# Patient Record
Sex: Female | Born: 1975
Health system: Southern US, Community
[De-identification: ages and names within clinical notes are randomized; demographics above are authoritative.]

## PROBLEM LIST (undated history)

## (undated) DIAGNOSIS — K589 Irritable bowel syndrome without diarrhea: Secondary | ICD-10-CM

## (undated) DIAGNOSIS — E039 Hypothyroidism, unspecified: Secondary | ICD-10-CM

## (undated) DIAGNOSIS — K5901 Slow transit constipation: Principal | ICD-10-CM

## (undated) DIAGNOSIS — O09519 Supervision of elderly primigravida, unspecified trimester: Secondary | ICD-10-CM

## (undated) HISTORY — DX: Slow transit constipation: K59.01

## (undated) HISTORY — PX: COLONOSCOPY: SHX174

## (undated) HISTORY — DX: Irritable bowel syndrome, unspecified: K58.9

## (undated) HISTORY — DX: Hypothyroidism, unspecified: E03.9

## (undated) HISTORY — DX: Supervision of elderly primigravida, unspecified trimester: O09.519

## (undated) HISTORY — PX: OTHER SURGICAL HISTORY: SHX169

---

## 2002-02-13 ENCOUNTER — Other Ambulatory Visit: Admission: RE | Admit: 2002-02-13 | Discharge: 2002-02-13 | Payer: Self-pay | Admitting: Family Medicine

## 2005-06-03 ENCOUNTER — Emergency Department (HOSPITAL_COMMUNITY): Admission: EM | Admit: 2005-06-03 | Discharge: 2005-06-03 | Payer: Self-pay | Admitting: Family Medicine

## 2011-11-30 HISTORY — PX: LASIK: SHX215

## 2013-10-19 ENCOUNTER — Encounter: Payer: Self-pay | Admitting: Internal Medicine

## 2013-10-19 ENCOUNTER — Ambulatory Visit (INDEPENDENT_AMBULATORY_CARE_PROVIDER_SITE_OTHER): Payer: Federal, State, Local not specified - PPO | Admitting: Internal Medicine

## 2013-10-19 VITALS — BP 110/64 | HR 75 | Temp 98.1°F | Resp 10 | Ht <= 58 in | Wt 123.4 lb

## 2013-10-19 DIAGNOSIS — E039 Hypothyroidism, unspecified: Secondary | ICD-10-CM

## 2013-10-19 LAB — TSH: TSH: 0.94 u[IU]/mL (ref 0.35–5.50)

## 2013-10-19 LAB — T3, FREE: T3, Free: 2.5 pg/mL (ref 2.3–4.2)

## 2013-10-19 LAB — T4, FREE: Free T4: 0.94 ng/dL (ref 0.60–1.60)

## 2013-10-19 NOTE — Progress Notes (Signed)
Patient ID: Ann Rogers, female   DOB: [birth year], 37 y.o.   MRN: [id number]   HPI  Ann Rogers is a 37 y.o.-year-old female, self- referred for evaluation for hypothyroidism.  Pt. has been dx with hypothyroidism 2 mo ago (TSH high, but pt not told how high, and records not available) >> started on Levothyroxine 25 mcg, taken: - fasting - with water - no b'fast  - prenatal MVI at night - no calcium - no iron - no PPIs  Pt denies feeling nodules in neck, hoarseness, dysphagia/odynophagia, SOB with lying down; she c/o: - + weight gain - no time to exercise b/c 12h work days  - she commutes to [place] daily. Skips b'fast and gets dinner on the way home. She drinks sodas. Gained 7 lbs.  - + fatigue - b/c of lifestyle - + constipation, but improved on Levothyroxine - + feeling cold - all her life - no dry skin - no hair falling - no problems with concentration - no depression  No FH of thyroid disorders. No FH of thyroid cancer. No h/o radiation tx to head or neck.  ROS: Constitutional: see above Eyes: no blurry vision, no xerophthalmia ENT: + sore throat, no nodules palpated in throat, no dysphagia/odynophagia, no hoarseness Cardiovascular: no CP/SOB/palpitations/leg swelling Respiratory: no cough/SOB Gastrointestinal: no N/V/D/+ C Musculoskeletal: no muscle/joint aches Skin: no rashes Neurological: no tremors/numbness/tingling/dizziness Psychiatric: no depression/anxiety  Past Medical History  Diagnosis Date  . Thyroid disease    History reviewed. No pertinent past surgical history. History   Social History  . Marital Status: Widowed    Spouse Name: N/A    Number of Children: 0   Occupational History  . [employer] employee - commutes to [place] daily   Social History Main Topics  . Smoking status: Never Smoker   . Smokeless tobacco: Not on file  . Alcohol Use: No  . Drug Use: No  . 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 Codeine Nausea And Vomiting   Family History  Problem Relation Age of Onset  . Stroke Mother   . Hypertension Father   . Hyperlipidemia Father     PE: BP 110/64  Pulse 75  Temp(Src) 98.1 F (36.7 C) (Oral)  Resp 10  Ht 4\' 10"  (1.473 m)  Wt 123 lb 6.4 oz (55.974 kg)  BMI 25.80 kg/m2  SpO2 95%  LMP 10/18/2013 Wt Readings from Last 3 Encounters:  10/19/13 123 lb 6.4 oz (55.974 kg)   Constitutional: normal weight, in NAD Eyes: PERRLA, EOMI, no exophthalmos ENT: moist mucous membranes, no thyromegaly, no cervical lymphadenopathy Cardiovascular: RRR, No MRG Respiratory: CTA B Gastrointestinal: abdomen soft, NT, ND, BS+ Musculoskeletal: no deformities, strength intact in all 4 Skin: moist, warm, no rashes Neurological: no tremor with outstretched hands, DTR normal in all 4  ASSESSMENT: 1. Hypothyroidism  PLAN:  1. Patient with newly dx hypothyroidism (high TSH), on levothyroxine therapy 25 mcg - takes it correctly. She appears euthyroid. - We discussed about correct intake of levothyroxine, fasting, with water, separated by at least 30 minutes from breakfast, and separated by more than 4 hours from calcium, iron, multivitamins, acid reflux medications (PPIs). - She does not appear to have a goiter, thyroid nodules, or neck compression symptoms - we'll check thyroid tests today: TSH, free T4,  anti-TPO Abx - If these are abnormal, she will need to return in 6-8 weeks for repeat labs. If she needs to continue thyroid hh, she prefers brand name Synthroid. If TSH is 5-20, we can just follow it.  - If these are normal, I will see her back in 3 months  Office Visit on 10/19/2013  Component Date Value Range Status  . TSH 10/19/2013 0.94  0.35 - 5.50 uIU/mL Final  . Free T4 10/19/2013 0.94  0.60 - 1.60 ng/dL Final  . T3, Free 16/08/9603  2.5  2.3 - 4.2 pg/mL Final  . Thyroid Peroxidase Antibody 10/19/2013 10.6  <35.0 IU/mL Final   Comment:                            The thyroid microsomal antigen has been shown to be Thyroid                          Peroxidase (TPO).  This assay detects anti-TPO antibodies.   Thyroid peroxidase antibodies not high, so probably not Hashimoto thyroiditis. We'll give the patient the option of either continuing the levothyroxine at the current dose or discontinuing it and rechecking labs in 6 weeks from now to see if we need to restart the thyroid hormone at that time.

## 2013-10-19 NOTE — Patient Instructions (Addendum)
Please come back for a follow-up appointment in 3 months. Please stop at the lab. Please join MyChart.  Hypothyroidism The thyroid is a large gland located in the lower front of your neck. The thyroid gland helps control metabolism. Metabolism is how your body handles food. It controls metabolism with the hormone thyroxine. When this gland is underactive (hypothyroid), it produces too little hormone.  CAUSES These include:   Absence or destruction of thyroid tissue.  Goiter due to iodine deficiency.  Goiter due to medications.  Congenital defects (since birth).  Problems with the pituitary. This causes a lack of TSH (thyroid stimulating hormone). This hormone tells the thyroid to turn out more hormone. SYMPTOMS  Lethargy (feeling as though you have no energy)  Cold intolerance  Weight gain (in spite of normal food intake)  Dry skin  Coarse hair  Menstrual irregularity (if severe, may lead to infertility)  Slowing of thought processes Cardiac problems are also caused by insufficient amounts of thyroid hormone. Hypothyroidism in the newborn is cretinism, and is an extreme form. It is important that this form be treated adequately and immediately or it will lead rapidly to retarded physical and mental development. DIAGNOSIS  To prove hypothyroidism, your caregiver may do blood tests and ultrasound tests. Sometimes the signs are hidden. It may be necessary for your caregiver to watch this illness with blood tests either before or after diagnosis and treatment. TREATMENT  Low levels of thyroid hormone are increased by using synthetic thyroid hormone. This is a safe, effective treatment. It usually takes about four weeks to gain the full effects of the medication. After you have the full effect of the medication, it will generally take another four weeks for problems to leave. Your caregiver may start you on low doses. If you have had heart problems the dose may be gradually increased.  It is generally not an emergency to get rapidly to normal. HOME CARE INSTRUCTIONS   Take your medications as your caregiver suggests. Let your caregiver know of any medications you are taking or start taking. Your caregiver will help you with dosage schedules.  As your condition improves, your dosage needs may increase. It will be necessary to have continuing blood tests as suggested by your caregiver.  Report all suspected medication side effects to your caregiver. SEEK MEDICAL CARE IF: Seek medical care if you develop:  Sweating.  Tremulousness (tremors).  Anxiety.  Rapid weight loss.  Heat intolerance.  Emotional swings.  Diarrhea.  Weakness. SEEK IMMEDIATE MEDICAL CARE IF:  You develop chest pain, an irregular heart beat (palpitations), or a rapid heart beat. MAKE SURE YOU:   Understand these instructions.  Will watch your condition.  Will get help right away if you are not doing well or get worse. Document Released: 11/15/2005 Document Revised: 02/07/2012 Document Reviewed: 07/05/2008 [facility] Patient Information 2014 [place], [place].

## 2013-10-22 ENCOUNTER — Encounter: Payer: Self-pay | Admitting: Internal Medicine

## 2013-10-22 LAB — THYROID PEROXIDASE ANTIBODY: Thyroperoxidase Ab SerPl-aCnc: 10.6 IU/mL (ref <35.0)

## 2013-10-23 ENCOUNTER — Encounter: Payer: Self-pay | Admitting: *Deleted

## 2013-11-06 ENCOUNTER — Telehealth: Payer: Self-pay | Admitting: *Deleted

## 2013-11-06 NOTE — Telephone Encounter (Signed)
Pt called requesting a follow up appt. She stated she had lab work done at her OB-Gyn office and her numbers are high now. They want her to begin on 75 mcg of levothyroxine. Pt is confused about what she should do. Advised pt to get a copy of labs faxed to our office, pt stated she would. Also, scheduled a follow up appt with Dr Elvera Lennox for pt on Monday, Dec 15th. Be advised.

## 2013-11-08 ENCOUNTER — Other Ambulatory Visit: Payer: Self-pay | Admitting: *Deleted

## 2013-11-08 MED ORDER — LEVOTHYROXINE SODIUM 25 MCG PO TABS: 25.0000 ug | ORAL_TABLET | Freq: Every day | ORAL | Status: DC

## 2013-11-12 ENCOUNTER — Ambulatory Visit (INDEPENDENT_AMBULATORY_CARE_PROVIDER_SITE_OTHER): Payer: Federal, State, Local not specified - PPO | Admitting: Internal Medicine

## 2013-11-12 ENCOUNTER — Encounter: Payer: Self-pay | Admitting: Internal Medicine

## 2013-11-12 VITALS — HR 90 | Temp 98.6°F | Resp 10 | Wt 124.9 lb

## 2013-11-12 DIAGNOSIS — E039 Hypothyroidism, unspecified: Secondary | ICD-10-CM

## 2013-11-12 NOTE — Progress Notes (Signed)
Patient ID: Ann Rogers, female   DOB: 03/25/1976, 37 y.o.   MRN: [id number]   HPI  Ann Rogers is a 37 y.o.-year-old female, self- referred for evaluation for hypothyroidism, dx 07/2013, returning for f/u. Last visit ~1 mo ago.  Pt. has been dx with hypothyroidism 3 mo ago (TSH high, but pt not told how high, and records not available) >> started on Levothyroxine 25 mcg, taken: - fasting - with water - no b'fast  - prenatal MVI at night - no calcium - no iron - no PPIs  Latest labs: Office Visit on 10/19/2013  Component Date Value Range Status  . TSH 10/19/2013 0.94  0.35 - 5.50 uIU/mL Final  . Free T4 10/19/2013 0.94  0.60 - 1.60 ng/dL Final  . T3, Free 11/91/4782 2.5  2.3 - 4.2 pg/mL Final  . Thyroid Peroxidase Antibody 10/19/2013 10.6  <35.0 IU/mL Final   Comment:                            The thyroid microsomal antigen has been shown to be Thyroid                          Peroxidase (TPO).  This assay detects anti-TPO antibodies.   Thyroid peroxidase antibodies not high, so probably not Hashimoto thyroiditis. I gave the patient the option of either continuing the levothyroxine at the current dose or discontinuing it and rechecking labs >> opted to continue, but had labs 11/05/2013 by ObGyn: TSH 4. 627 (0.350-4.5). She was advised to increase the Synthroid to 75 mcg, but she wanted to check with me first.  Pt denies feeling nodules in neck, hoarseness, dysphagia/odynophagia, SOB with lying down; she c/o: - + weight gain - no time to exercise b/c 12h work days  - she commutes to [place] daily. Skips b'fast and gets dinner on the way home. She drinks sodas. Gained 7 lbs.  - + fatigue - b/c of lifestyle - + constipation, improved on Levothyroxine, but still bothersome (no stool in 2 weeks!) - + feeling cold - all her life - no dry skin - no hair falling - no problems with concentration - no depression  I reviewed pt's medications, allergies, PMH, social hx, family  hx and no changes required, except as mentioned above.   ROS: Constitutional: see above Eyes: no blurry vision, no xerophthalmia ENT: no sore throat, no nodules palpated in throat, no dysphagia/odynophagia, no hoarseness Cardiovascular: no CP/SOB/palpitations/leg swelling Respiratory: no cough/SOB Gastrointestinal: no N/V/D/+++ C Musculoskeletal: no muscle/joint aches Skin: no rashes Neurological: no tremors/numbness/tingling/dizziness  I reviewed pt's medications, allergies, PMH, social hx, family hx and no changes required, except as mentioned above.  PE: Pulse 90  Temp(Src) 98.6 F (37 C) (Oral)  Resp 10  Wt 124 lb 14.4 oz (56.654 kg)  SpO2 96%  LMP 10/18/2013 Wt Readings from Last 3 Encounters:  11/12/13 124 lb 14.4 oz (56.654 kg)  10/19/13 123 lb 6.4 oz (55.974 kg)   Constitutional: normal weight, in NAD Eyes: PERRLA, EOMI, no exophthalmos ENT: moist mucous membranes, no thyromegaly, no cervical lymphadenopathy Cardiovascular: RRR, No MRG Respiratory: CTA B Gastrointestinal: abdomen soft, NT, ND, BS+ Musculoskeletal: no deformities, strength intact in all 4 Skin: moist, warm, no rashes Neurological: no tremor with outstretched hands, DTR normal in all 4  ASSESSMENT: 1. Hypothyroidism  PLAN:  1. Patient with newly dx hypothyroidism (high TSH), on levothyroxine therapy  25 mcg - takes it correctly. She appears euthyroid. - at last visit, thyroid tests were all normal TSH, free T4, anti-TPO Abx; but 2 weeks later >> TSH increased at [facility] office; I suspect the difference was due to TSH assay difference - we decided to have her stay on the 25 mcg for now and she will need to return in 6-8 weeks for repeat labs. She is happy with this plan. - advised her to use Magnesium 500 mg daily for constipation - I will see her back in 6 months  ObGyn: Dr Teodora Medici

## 2013-11-12 NOTE — Patient Instructions (Signed)
Please come back for a follow-up appointment in 6 months. Please return in 2 months for labs. Continue the Synthroid at 25 mcg daily for now. Try Magnesium 500 mg daily for constipation.

## 2013-12-31 ENCOUNTER — Ambulatory Visit (INDEPENDENT_AMBULATORY_CARE_PROVIDER_SITE_OTHER): Payer: Federal, State, Local not specified - PPO | Admitting: Emergency Medicine

## 2013-12-31 VITALS — BP 100/60 | HR 68 | Temp 98.5°F | Resp 16 | Ht 59.0 in | Wt 121.0 lb

## 2013-12-31 DIAGNOSIS — J101 Influenza due to other identified influenza virus with other respiratory manifestations: Secondary | ICD-10-CM

## 2013-12-31 DIAGNOSIS — J111 Influenza due to unidentified influenza virus with other respiratory manifestations: Secondary | ICD-10-CM

## 2013-12-31 DIAGNOSIS — R6889 Other general symptoms and signs: Secondary | ICD-10-CM

## 2013-12-31 LAB — POCT INFLUENZA A/B
INFLUENZA A, POC: POSITIVE
Influenza B, POC: NEGATIVE

## 2013-12-31 MED ORDER — OSELTAMIVIR PHOSPHATE 75 MG PO CAPS: 75.0000 mg | ORAL_CAPSULE | Freq: Two times a day (BID) | ORAL | Status: DC

## 2013-12-31 MED ORDER — HYDROCOD POLST-CHLORPHEN POLST 10-8 MG/5ML PO LQCR: 5.0000 mL | Freq: Two times a day (BID) | ORAL | Status: DC | PRN

## 2013-12-31 NOTE — Progress Notes (Signed)
   Subjective:    Patient ID: Ann Rogers, female    DOB: 11/03/1976, 38 y.o.   MRN: [id number]  HPI patient presents with 36 history of head congestion and cough. She has had sweats and chills on Saturday. She had some myalgias with these symptoms. She denies having a productive cough.    Review of Systems     Objective:   Physical Exam HEENT exam TMs are clear nose is normal throat is slightly red. Neck is supple. Chest is clear to both auscultation and percussion Results for orders placed in visit on 12/31/13  POCT INFLUENZA A/B      Result Value Range   Influenza A, POC Positive     Influenza B, POC Negative           Assessment & Plan:  We'll treat with Tamiflu and Tussionex. She was given a note until Thursday

## 2013-12-31 NOTE — Patient Instructions (Signed)

## 2014-01-11 DIAGNOSIS — K589 Irritable bowel syndrome without diarrhea: Secondary | ICD-10-CM | POA: Insufficient documentation

## 2014-01-15 ENCOUNTER — Other Ambulatory Visit: Payer: Federal, State, Local not specified - PPO

## 2014-01-23 ENCOUNTER — Other Ambulatory Visit (INDEPENDENT_AMBULATORY_CARE_PROVIDER_SITE_OTHER): Payer: Federal, State, Local not specified - PPO

## 2014-01-23 DIAGNOSIS — E039 Hypothyroidism, unspecified: Secondary | ICD-10-CM

## 2014-01-23 LAB — T4, FREE: FREE T4: 1.03 ng/dL (ref 0.60–1.60)

## 2014-01-23 LAB — TSH: TSH: 1.51 u[IU]/mL (ref 0.35–5.50)

## 2014-01-24 ENCOUNTER — Ambulatory Visit: Payer: Federal, State, Local not specified - PPO | Admitting: Internal Medicine

## 2014-02-25 ENCOUNTER — Encounter: Payer: Self-pay | Admitting: Internal Medicine

## 2014-04-17 ENCOUNTER — Ambulatory Visit: Payer: Federal, State, Local not specified - PPO | Admitting: Internal Medicine

## 2014-05-02 ENCOUNTER — Ambulatory Visit: Payer: Federal, State, Local not specified - PPO | Admitting: Internal Medicine

## 2014-05-13 ENCOUNTER — Encounter: Payer: Self-pay | Admitting: Internal Medicine

## 2014-05-13 ENCOUNTER — Ambulatory Visit (INDEPENDENT_AMBULATORY_CARE_PROVIDER_SITE_OTHER): Payer: Federal, State, Local not specified - PPO | Admitting: Internal Medicine

## 2014-05-13 VITALS — BP 108/68 | HR 80 | Temp 97.9°F | Resp 14 | Ht 59.0 in | Wt 114.8 lb

## 2014-05-13 DIAGNOSIS — E039 Hypothyroidism, unspecified: Secondary | ICD-10-CM

## 2014-05-13 NOTE — Progress Notes (Signed)
Patient ID: Ann Fettersomeika L Saine, female   DOB: 04/15/1976, 38 y.o.   MRN: [id number]   HPI  Ann Rogers is a 38 y.o.-year-old female, returning for f/u forhypothyroidism, dx 07/2013. Last visit 6 mo ago.  Reviewed hx: Pt. has been dx with hypothyroidism in 07/2013 (TSH high, but pt not told how high, and records not available) >> started on Levothyroxine 25 mcg.  She takes the LT4: - fasting - with water - no b'fast  - prenatal MVI at night - no calcium - no iron - no PPIs  Latest labs: Lab Results  Component Value Date   TSH 1.51 01/23/2014   TSH 0.94 10/19/2013   FREET4 1.03 01/23/2014   FREET4 0.94 10/19/2013   . Thyroid Peroxidase Antibody 10/19/2013 10.6  <35.0 IU/mL Final  Thyroid peroxidase antibodies not high, so likely not Hashimoto thyroiditis.   I gave the patient the option of either continuing the levothyroxine at the current dose or discontinuing it and rechecking labs >> opted to continue. A new set of labs in 12/2013 >> normal (see above).  Pt denies feeling nodules in neck, hoarseness, dysphagia/odynophagia, SOB with lying down.  She c/o: - + weight loss - lost 10 lbs since last visit ! - + fatigue - b/c of lifestyle - + constipation, very bothersome (has a stool every 2 weeks) >> will see Dr Leone PayorGessner soon - + feeling cold - all her life - no dry skin - no hair falling - no problems with concentration - no depression  She tells me she would like to get pregnant (IVF, sperm donor) - in 11/2014.   I reviewed pt's medications, allergies, PMH, social hx, family hx and no changes required, except as mentioned above.   ROS: Constitutional: see above Eyes: no blurry vision, no xerophthalmia ENT: no sore throat, no nodules palpated in throat, no dysphagia/odynophagia, no hoarseness Cardiovascular: no CP/SOB/palpitations/leg swelling Respiratory: no cough/SOB Gastrointestinal: no N/V/D/+++ C Musculoskeletal: no muscle/joint aches Skin: no  rashes Neurological: no tremors/numbness/tingling/dizziness  I reviewed pt's medications, allergies, PMH, social hx, family hx and no changes required, except as mentioned above.  PE: BP 108/68  Pulse 80  Temp(Src) 97.9 F (36.6 C)  Resp 14  Ht 4\' 11"  (1.499 m)  Wt 114 lb 12.8 oz (52.073 kg)  BMI 23.17 kg/m2  SpO2 97% Wt Readings from Last 3 Encounters:  05/13/14 114 lb 12.8 oz (52.073 kg)  12/31/13 121 lb (54.885 kg)  11/12/13 124 lb 14.4 oz (56.654 kg)   Constitutional: normal weight, in NAD Eyes: PERRLA, EOMI, no exophthalmos ENT: moist mucous membranes, no thyromegaly, no cervical lymphadenopathy Cardiovascular: RRR, No MRG Respiratory: CTA B Gastrointestinal: abdomen soft, NT, ND, BS+ Musculoskeletal: no deformities, strength intact in all 4 Skin: moist, warm, no rashes Neurological: no tremor with outstretched hands, DTR normal in all 4  ASSESSMENT: 1. Hypothyroidism  PLAN:  1. Patient with mild hypothyroidism (high TSH), on levothyroxine (LT4) therapy 25 mcg - takes it correctly. She appears euthyroid. - at last check, thyroid tests were all normal TSH, free T4, anti-TPO Abx - we will recheck TSH, free t4 today - we decided to have her stay on the LT4 25 mcg for now, especially since she tries to get pregnant - I advised her to come back in 09/2014 for TFTs (ordered), ~1.5 mo before her IVF. I will then see her 1 mo after she gets pregnant, or in 1 year, whichever is sooner.  Office Visit on 05/13/2014  Component Date  Value Ref Range Status  . TSH 05/13/2014 0.59  0.35 - 4.50 uIU/mL Final  . Free T4 05/13/2014 1.00  0.60 - 1.60 ng/dL Final   Labs normal. Continue LT4 25 mcg daily.

## 2014-05-13 NOTE — Patient Instructions (Signed)
Please return in 09/2014 for repeat labs. Please come back for an appointment 1 month after the pregnancy.

## 2014-05-14 LAB — T4, FREE: Free T4: 1 ng/dL (ref 0.60–1.60)

## 2014-05-14 LAB — TSH: TSH: 0.59 u[IU]/mL (ref 0.35–4.50)

## 2014-06-06 ENCOUNTER — Ambulatory Visit (INDEPENDENT_AMBULATORY_CARE_PROVIDER_SITE_OTHER): Payer: Federal, State, Local not specified - PPO | Admitting: Internal Medicine

## 2014-06-06 ENCOUNTER — Other Ambulatory Visit: Payer: Federal, State, Local not specified - PPO

## 2014-06-06 ENCOUNTER — Other Ambulatory Visit (INDEPENDENT_AMBULATORY_CARE_PROVIDER_SITE_OTHER): Payer: Federal, State, Local not specified - PPO

## 2014-06-06 ENCOUNTER — Encounter: Payer: Self-pay | Admitting: Internal Medicine

## 2014-06-06 VITALS — BP 102/60 | HR 68 | Ht 58.66 in | Wt 116.1 lb

## 2014-06-06 DIAGNOSIS — K5901 Slow transit constipation: Secondary | ICD-10-CM

## 2014-06-06 DIAGNOSIS — K5909 Other constipation: Secondary | ICD-10-CM

## 2014-06-06 DIAGNOSIS — K5900 Constipation, unspecified: Secondary | ICD-10-CM

## 2014-06-06 HISTORY — DX: Slow transit constipation: K59.01

## 2014-06-06 LAB — IGA: IgA: 211 mg/dL (ref 68–378)

## 2014-06-06 NOTE — Progress Notes (Signed)
Referred by: Jarome Matin, MD  Subjective:    Patient ID: Ann Rogers, female    DOB: 1975-12-01, 38 y.o.   MRN: [id number]  HPI Patient is a very nice single woman with a long history of constipation dating back to her high school years. She generally moves her bowels about once a week with intermittent progressive bloating and abdominal distention. When she has her menstrual period she tends to move her bowels more. She has tried MiraLax with some benefit but had to titrate up on the dosage. In the past 2 months Linzess 290 mcg daily did not really help. He also uses some sort of 2 units seems to make a difference for her. It helped her defecate. She is not having bleeding. She says that when she gets an urge to defecate she does not have to sit or strain her have difficulty passing of stool. Dulcolax also helps her defecate. She is planning to become pregnant and next year and is concerned that pregnancy exacerbates constipation and would like to feel improved prior to that. Her GI review of systems is otherwise negative. Allergies  Allergen Reactions  . Codeine Nausea And Vomiting   Outpatient Prescriptions Prior to Visit  Medication Sig Dispense Refill  . levothyroxine (SYNTHROID, LEVOTHROID) 25 MCG tablet Take 1 tablet (25 mcg total) by mouth daily before breakfast.  30 tablet  11   No facility-administered medications prior to visit.   Past Medical History  Diagnosis Date  . Hypothyroidism   . IBS (irritable bowel syndrome)    Past Surgical History  Procedure Laterality Date  . Lasik  2013  . Egg preservation     History   Social History  . Marital Status: Widowed    Spouse Name: N/A    Number of Children: 0  . Years of Education: N/A   Occupational History  . Chiropodist at [employer]   .     Social History Main Topics  . Smoking status: Never Smoker   . Smokeless tobacco: Never Used  . Alcohol Use: No  . Drug Use: No  . Sexual Activity: No     Other Topics Concern  . None   Social History Narrative   She is single, no children lives in [place]    [employer]   Regular exercise: no   Caffeine use: coffee in the am; 3-4 sodas daily   Last updated 06/06/2014            Family History  Problem Relation Age of Onset  . Stroke Mother   . Hypertension Father   . Hyperlipidemia Father          Review of Systems This is positive for those things mentioned in the history of present illness. All other review of systems negative.    Objective:   Physical Exam General:  Well-developed, well-nourished and in no acute distress Eyes:  anicteric. ENT:   Mouth and posterior pharynx free of lesions.  Neck:   supple w/o thyromegaly or mass.  Lungs: Clear to auscultation bilaterally. Heart:  S1S2, no rubs, murmurs, gallops. Abdomen:  soft, non-tender, no hepatosplenomegaly, hernia, or mass and BS+.  Rectal:  Patti [place], CMA present.   Anoderm inspection revealed normal Anal wink was absent Digital exam revealed normal resting tone and voluntary squeeze. No mass or rectocele present. Simulated defecation with valsalva revealed appropriate abdominal contraction and descent.    Lymph:  no cervical or supraclavicular adenopathy. Extremities:  no edema Skin   no rash. Neuro:  A&O x 3.  Psych:  appropriate mood and  Affect.   Data Reviewed:  Endocrinology notes, primary care notes Lab Results  Component Value Date   TSH 0.59 05/13/2014      Assessment & Plan:    Chronic constipation This sounds most like slow transit constipation problems. Cause of this could be primary idiopathic motility disturbance, bacterial overgrowth is possible, there is a small chance of celiac disease causing some of her problems as well.  #1 she will do a Sitzmarks study to determine motility of the colon #2 after that she can use intermittent Dulcolax, also MiraLax #3 depending upon  the results of the above she could need an anorectal manometry study. #4 we will call her with results and plans in followup. Since Linzess didn't seem to help I don't think Amitiza would Though it might  CC: PATERSON,DANIEL G, MD I appreciate the opportunity to care for this patient.

## 2014-06-06 NOTE — Assessment & Plan Note (Addendum)
This sounds most like slow transit constipation problems. Cause of this could be primary idiopathic motility disturbance, bacterial overgrowth is possible, there is a small chance of celiac disease causing some of her problems as well.  #1 she will do a Sitzmarks study to determine motility of the colon #2 after that she can use intermittent Dulcolax, also MiraLax #3 depending upon the results of the above she could need an anorectal manometry study. #4 we will call her with results and plans in followup. Since Linzess didn't seem to help I don't think Amitiza would Though it might  CC: PATERSON,DANIEL G, MD I appreciate the opportunity to care for this patient.

## 2014-06-06 NOTE — Patient Instructions (Addendum)
We have sent the following medications to your pharmacy for you to pick up at your convenience: TTG, IGA  You may use dulcolax as needed after you do the sitzmark test.   Today you have been given a Sitzmark test today and please come back Tues. 06/11/14 and go to our basement radiology dept to get an x-ray.  Do not use an laxatives until this test is completed.   I appreciate the opportunity to care for you.

## 2014-06-07 LAB — TISSUE TRANSGLUTAMINASE, IGA: Tissue Transglutaminase Ab, IgA: 3.4 U/mL (ref <20)

## 2014-06-09 NOTE — Progress Notes (Signed)
Quick Note:  Does not have celiac dz by this Awaiting sitz marks results ______

## 2014-06-11 ENCOUNTER — Ambulatory Visit (INDEPENDENT_AMBULATORY_CARE_PROVIDER_SITE_OTHER)
Admission: RE | Admit: 2014-06-11 | Discharge: 2014-06-11 | Disposition: A | Discharge status: Home / Self Care | Payer: Federal, State, Local not specified - PPO | Source: Ambulatory Visit | Attending: Internal Medicine | Admitting: Internal Medicine

## 2014-06-11 DIAGNOSIS — K5909 Other constipation: Secondary | ICD-10-CM

## 2014-06-11 DIAGNOSIS — K5900 Constipation, unspecified: Secondary | ICD-10-CM

## 2014-06-12 ENCOUNTER — Other Ambulatory Visit: Payer: Self-pay

## 2014-06-12 NOTE — Progress Notes (Signed)
Quick Note:  Also let her know testing negative for celiac dz ______

## 2014-06-12 NOTE — Progress Notes (Signed)
Quick Note:  All markers are in left colon.  So, could be an outlet problem also - need to check anorectal function with an anorectal manometry - please explain and schedule ______

## 2014-07-01 ENCOUNTER — Ambulatory Visit (HOSPITAL_COMMUNITY)
Admission: RE | Admit: 2014-07-01 | Discharge: 2014-07-01 | Disposition: A | Discharge status: Home / Self Care | Payer: Federal, State, Local not specified - PPO | Source: Ambulatory Visit | Attending: Internal Medicine | Admitting: Internal Medicine

## 2014-07-01 ENCOUNTER — Encounter (HOSPITAL_COMMUNITY)
Admission: RE | Disposition: A | Discharge status: Home / Self Care | Payer: Federal, State, Local not specified - PPO | Source: Ambulatory Visit | Attending: Internal Medicine

## 2014-07-01 DIAGNOSIS — K5900 Constipation, unspecified: Secondary | ICD-10-CM | POA: Insufficient documentation

## 2014-07-01 HISTORY — PX: ANAL RECTAL MANOMETRY: SHX6358

## 2014-07-01 SURGERY — MANOMETRY, ANORECTAL

## 2014-07-02 ENCOUNTER — Encounter (HOSPITAL_COMMUNITY): Payer: Self-pay | Admitting: Internal Medicine

## 2014-07-02 DIAGNOSIS — K5900 Constipation, unspecified: Secondary | ICD-10-CM

## 2014-07-16 ENCOUNTER — Telehealth: Payer: Self-pay | Admitting: Internal Medicine

## 2014-07-16 NOTE — Telephone Encounter (Signed)
Sorry for delay Ano-rectal manometry was ok except for slight impairment of relaxation of her pelvic floor with simulated defaction.  Ask her to schedule a f/u with mein office  Also find out current bowel habits and laxative use

## 2014-07-16 NOTE — Telephone Encounter (Signed)
Spoke with patient and gave her results. Scheduled OV on 08/07/14 at 2:15 PM. Patient states after the manometry she started the Linzess 290 mcg daily and she is having a BM daily now.

## 2014-07-16 NOTE — Telephone Encounter (Signed)
Patient is asking for manometry results. Please, advise. 

## 2014-07-16 NOTE — Telephone Encounter (Signed)
Left a message to call back.

## 2014-08-07 ENCOUNTER — Ambulatory Visit (INDEPENDENT_AMBULATORY_CARE_PROVIDER_SITE_OTHER): Payer: Federal, State, Local not specified - PPO | Admitting: Internal Medicine

## 2014-08-07 ENCOUNTER — Encounter: Payer: Self-pay | Admitting: Internal Medicine

## 2014-08-07 VITALS — BP 92/60 | HR 88 | Ht 59.0 in | Wt 116.0 lb

## 2014-08-07 DIAGNOSIS — K5901 Slow transit constipation: Secondary | ICD-10-CM

## 2014-08-07 MED ORDER — LUBIPROSTONE 24 MCG PO CAPS: 24.0000 ug | ORAL_CAPSULE | Freq: Two times a day (BID) | ORAL | Status: DC

## 2014-08-07 NOTE — Patient Instructions (Signed)
Per Dr. Leone Payor, take 4 Dulcolax, purchased over the counter.  1 hour later, mix one bottle of Miralax in 64 ounces of Gatorade, drinking it over 1-2 hours.  Continue taking your Linzess.  Dr. Leone Payor has also called in Amitiza - take once a day with food.  Please call Dr. Leone Payor in early October with an update on how this is working.  Please follow up on 10/07/2014 at 2:45

## 2014-08-08 ENCOUNTER — Encounter: Payer: Self-pay | Admitting: Internal Medicine

## 2014-08-08 NOTE — Assessment & Plan Note (Signed)
MiraLax purge then use Linzess and Amitiza F/U 2 months Communicate failure in between Other options include colchicine - ? Retry intermittent bisacodyl - she says if empty some does not cramp as much Had brief discussion about surgical options - do not think appropriate at this time, would consider tertiary evaluation with GI before that

## 2014-08-08 NOTE — Progress Notes (Signed)
   Subjective:    Patient ID: Ann Rogers, female    DOB: 09-16-1976, 38 y.o.   MRN: [id number]  HPI The patient is here and has persistent constipation problems. Sitz marks study showed all or most markers in left colon as high as splenic flexure. Anorectal manometry NL except slight decreased pelvic floor relaxation. Linzess 290 ug daily not helping much, defecates q 5 days with bloating in between. Has success with bisacodyl in past but bad cramps. Medications, allergies, past medical history, past surgical history, family history and social history are reviewed and updated in the EMR.  Review of Systems As above    Objective:   Physical Exam WDWN NAD    Assessment & Plan:  Slow transit constipation MiraLax purge then use Linzess and Amitiza F/U 2 months Communicate failure in between Other options include colchicine - ? Retry intermittent bisacodyl - she says if empty some does not cramp as much Had brief discussion about surgical options - do not think appropriate at this time, would consider tertiary evaluation with GI before that    15 minutes time spent with patient > half in counseling coordination of care  LK:[id number] G, MD

## 2014-08-19 DIAGNOSIS — N978 Female infertility of other origin: Secondary | ICD-10-CM | POA: Insufficient documentation

## 2014-10-07 ENCOUNTER — Encounter: Payer: Self-pay | Admitting: Internal Medicine

## 2014-10-07 ENCOUNTER — Ambulatory Visit (INDEPENDENT_AMBULATORY_CARE_PROVIDER_SITE_OTHER): Payer: Federal, State, Local not specified - PPO | Admitting: Internal Medicine

## 2014-10-07 VITALS — BP 118/70 | HR 72 | Ht 59.0 in | Wt 113.2 lb

## 2014-10-07 DIAGNOSIS — K5901 Slow transit constipation: Secondary | ICD-10-CM

## 2014-10-07 NOTE — Patient Instructions (Signed)
Please follow up with Dr Leone PayorGessner as needed.    I appreciate the opportunity to care for you.

## 2014-10-07 NOTE — Assessment & Plan Note (Addendum)
Doing well on Linzess and amitiza Will continue These are pregnancy category C so may need to stop if she decides to get pregnant See me prn

## 2014-10-07 NOTE — Progress Notes (Signed)
   Subjective:    Patient ID: Ann Rogers, female    DOB: 05/10/1976, 38 y.o.   MRN: [id number]  HPI The patient is here for follow-up. She says things are going very well using Linzess and Amitiza together. She is having regular defecation without difficulty. She is considering getting pregnant next year. She wonders if she will have to stop the medications. Medications, allergies, past medical history, past surgical history, family history and social history are reviewed and updated in the EMR.  Review of Systems As above    Objective:   Physical Exam Pleasant young middle-aged woman in no acute distress     Assessment & Plan:  Slow transit constipation Doing well on Linzess and amitiza Will continue These are pregnancy category C so may need to stop if she decides to get pregnant See me prn   I appreciate the opportunity to care for this patient. CC: Garlan FillersPATERSON,DANIEL G, MD

## 2014-10-13 ENCOUNTER — Ambulatory Visit (INDEPENDENT_AMBULATORY_CARE_PROVIDER_SITE_OTHER): Payer: Federal, State, Local not specified - PPO | Admitting: Internal Medicine

## 2014-10-13 VITALS — BP 104/73 | HR 73 | Temp 98.2°F | Resp 12 | Ht 59.0 in | Wt 111.2 lb

## 2014-10-13 DIAGNOSIS — R059 Cough, unspecified: Secondary | ICD-10-CM

## 2014-10-13 DIAGNOSIS — J069 Acute upper respiratory infection, unspecified: Secondary | ICD-10-CM

## 2014-10-13 DIAGNOSIS — R05 Cough: Secondary | ICD-10-CM

## 2014-10-13 MED ORDER — HYDROCOD POLST-CHLORPHEN POLST 10-8 MG/5ML PO LQCR: 5.0000 mL | Freq: Two times a day (BID) | ORAL | Status: DC

## 2014-10-13 MED ORDER — LIDOCAINE VISCOUS 2 % MT SOLN: OROMUCOSAL | Status: DC

## 2014-10-13 NOTE — Progress Notes (Signed)
   Subjective:    Patient ID: Ann Rogers, female    DOB: 03/06/1976, 38 y.o.   MRN: [id number] This chart was scribed for Ellamae Siaobert Zarin Hagmann, MD by Jolene Provostobert Halas, Medical Scribe. This patient was seen in Room 5 and the patient's care was started a 1:57 PM.   HPI HPI Comments: Ann Rogers is a 38 y.o. female who [facility] to [facility] complaining of constant nasal congestion that started two days ago. Pt endorses associated rhinorrhea productive of yellow purulent drainage, cough, and sore throat. Pt denies fever, chills, or appetite changes. Pt has had a flu shot.     Review of Systems  Constitutional: Negative for fever, chills and appetite change.  HENT: Positive for congestion, postnasal drip, rhinorrhea, sinus pressure and sore throat.   Gastrointestinal: Negative for nausea and vomiting.  Neurological: Negative for headaches.       Objective:   Physical Exam  Constitutional: She is oriented to person, place, and time. She appears well-developed and well-nourished.  HENT:  Head: Normocephalic and atraumatic.  Right Ear: Tympanic membrane and ear canal normal.  Left Ear: Tympanic membrane and ear canal normal.  Nose: Nose normal.  Mouth/Throat: Posterior oropharyngeal erythema present.  Eyes: Pupils are equal, round, and reactive to light.  Neck: Neck supple.  Cardiovascular: Normal rate, regular rhythm and normal heart sounds.   Pulmonary/Chest: Effort normal and breath sounds normal. No respiratory distress. She has no wheezes.  Lymphadenopathy:    She has no cervical adenopathy.  Neurological: She is alert and oriented to person, place, and time.  Skin: Skin is warm and dry.  Psychiatric: She has a normal mood and affect. Her behavior is normal.  Nursing note and vitals reviewed.      Assessment & Plan:   Cough  Viral URI  Meds ordered this encounter  Medications  . chlorpheniramine-HYDROcodone (TUSSIONEX PENNKINETIC ER) 10-8 MG/5ML LQCR    Sig: Take 5  mLs by mouth 2 (two) times daily.    Dispense:  115 mL    Refill:  0  . lidocaine (XYLOCAINE) 2 % solution    Sig: Use 1 teaspoon every 2 hours to swish and swallow or spit as needed for pain    Dispense:  60 mL    Refill:  0  sudafed  I have completed the patient encounter in its entirety as documented by the scribe, with editing by me where necessary. Dandria Griego P. Merla Richesoolittle, M.D.

## 2014-10-23 ENCOUNTER — Encounter: Payer: Self-pay | Admitting: Internal Medicine

## 2014-10-23 ENCOUNTER — Other Ambulatory Visit: Payer: Self-pay | Admitting: Internal Medicine

## 2014-10-23 ENCOUNTER — Ambulatory Visit (INDEPENDENT_AMBULATORY_CARE_PROVIDER_SITE_OTHER): Payer: Federal, State, Local not specified - PPO | Admitting: Internal Medicine

## 2014-10-23 VITALS — BP 110/68 | HR 84 | Temp 97.9°F | Resp 12 | Wt 112.0 lb

## 2014-10-23 DIAGNOSIS — E039 Hypothyroidism, unspecified: Secondary | ICD-10-CM

## 2014-10-23 LAB — T4, FREE: FREE T4: 1.25 ng/dL (ref 0.80–1.80)

## 2014-10-23 LAB — TSH: TSH: 1.784 u[IU]/mL (ref 0.350–4.500)

## 2014-10-23 NOTE — Progress Notes (Signed)
Patient ID: Ann Rogers, female   DOB: 1976-04-02, 38 y.o.   MRN: [id number]   HPI  Ann Rogers is a 38 y.o.-year-old female, returning for f/u for hypothyroidism, dx 07/2013. Last visit 5 mo ago.  She is not planning of getting pregnant right now that she met someone.  Reviewed hx: Pt. has been dx with hypothyroidism in 07/2013 (TSH high, but pt not told how high, and records not available) >> started on Levothyroxine 25 mcg.  She takes the LT4: - fasting - with water - no b'fast, but started coffee with milk 2h later - prenatal MVI at night - no calcium - no iron - no PPIs  Latest labs: Lab Results  Component Value Date   TSH 0.59 05/13/2014   TSH 1.51 01/23/2014   TSH 0.94 10/19/2013   FREET4 1.00 05/13/2014   FREET4 1.03 01/23/2014   FREET4 0.94 10/19/2013   . Thyroid Peroxidase Antibody 10/19/2013 10.6  <35.0 IU/mL Final  Thyroid peroxidase antibodies not high, so likely not Hashimoto thyroiditis.   At last visits, I gave the patient the option of either continuing the levothyroxine at the current dose or discontinuing it and rechecking labs >> opted to continue.   Pt denies feeling nodules in neck, hoarseness, dysphagia/odynophagia, SOB with lying down.  She c/o: - no weight loss/gain - + fatigue - b/c of lifestyle (commuting 1.15 min back and forth) - + constipation, very bothersome (has a stool every 2 weeks) >> saw Dr Carlean Purl >> started Linzess and Amitiza >> much better - + feeling cold - persistent - no dry skin - no hair falling - no problems with concentration - no depression  She tells me she would like to get pregnant (IVF, sperm donor) - in 11/2014.   I reviewed pt's medications, allergies, PMH, social hx, family hx and no changes required, except as mentioned above.   ROS: Constitutional: see above Eyes: no blurry vision, no xerophthalmia ENT: no sore throat, no nodules palpated in throat, no dysphagia/odynophagia, no  hoarseness Cardiovascular: no CP/SOB/palpitations/leg swelling Respiratory: no cough/SOB Gastrointestinal: no N/V/D/C Musculoskeletal: no muscle/joint aches Skin: no rashes Neurological: no tremors/numbness/tingling/dizziness  I reviewed pt's medications, allergies, PMH, social hx, family hx and no changes required, except as mentioned above.  PE: BP 110/68 mmHg  Pulse 84  Temp(Src) 97.9 F (36.6 C) (Oral)  Resp 12  Wt 112 lb (50.803 kg)  SpO2 98%  LMP 10/06/2014 Wt Readings from Last 3 Encounters:  10/23/14 112 lb (50.803 kg)  10/13/14 111 lb 4 oz (50.463 kg)  10/07/14 113 lb 3.2 oz (51.347 kg)   Constitutional: normal weight, in NAD Eyes: PERRLA, EOMI, no exophthalmos ENT: moist mucous membranes, no thyromegaly, no cervical lymphadenopathy Cardiovascular: RRR, No MRG Respiratory: CTA B Gastrointestinal: abdomen soft, NT, ND, BS+ Musculoskeletal: no deformities, strength intact in all 4 Skin: moist, warm, no rashes Neurological: + mild tremor with outstretched hands, DTR normal in all 4  ASSESSMENT: 1. Hypothyroidism  PLAN:  1. Patient with mild hypothyroidism (high TSH), on levothyroxine (LT4) therapy 25 mcg - takes it correctly. She appears euthyroid. - we reviewed her latest thyroid tests - all normal - 04/2014 - we will recheck TSH, free t4 today - continue LT4 25 mcg for now - at last visit, she was telling me she wanted to get pregnant (IVF), but since then she met someone and she will wait a little longer - will let me know if she wants to get pregnant >> in  that cas, we need to aim for a TSH <2.5 - I advised her to come back in 09/2015 or right before the pregnancy, whichever is sooner - She needs a refill on LT4 when labs are back.  Orders Only on 10/23/2014  Component Date Value Ref Range Status  . TSH 10/23/2014 1.784  0.350 - 4.500 uIU/mL Final  . Free T4 10/23/2014 1.25  0.80 - 1.80 ng/dL Final   Normal TFTs.

## 2014-10-23 NOTE — Patient Instructions (Signed)
Please stop at [facility] lab downstairs. Please come back for a follow-up appointment in 1 year or before you plan the pregnancy, whichever is sonner.

## 2014-10-24 MED ORDER — LEVOTHYROXINE SODIUM 25 MCG PO TABS: 25.0000 ug | ORAL_TABLET | Freq: Every day | ORAL | Status: DC

## 2015-03-04 ENCOUNTER — Ambulatory Visit (INDEPENDENT_AMBULATORY_CARE_PROVIDER_SITE_OTHER): Payer: Federal, State, Local not specified - PPO | Admitting: Sports Medicine

## 2015-03-04 VITALS — BP 108/62 | HR 84 | Temp 98.4°F | Resp 18 | Ht 59.0 in | Wt 114.0 lb

## 2015-03-04 DIAGNOSIS — J0100 Acute maxillary sinusitis, unspecified: Secondary | ICD-10-CM

## 2015-03-04 MED ORDER — AZITHROMYCIN 250 MG PO TABS: ORAL_TABLET | ORAL | Status: DC

## 2015-03-04 MED ORDER — HYDROCOD POLST-CHLORPHEN POLST 10-8 MG/5ML PO LQCR: 5.0000 mL | Freq: Two times a day (BID) | ORAL | Status: DC

## 2015-03-04 NOTE — Patient Instructions (Signed)

## 2015-03-04 NOTE — Progress Notes (Signed)
   Subjective:    Patient ID: Beatrix Fettersomeika L Musolino, female    DOB: 09/21/1976, 39 y.o.   MRN: [id number]  HPI Ms. Amie CritchleyBlackwell is a 39 year old female who presents with cough, congestion, and sore throat. Onset was approximately one week ago. Symptoms started with a sore throat and congestion. She has had increasing maxillary sinus pressure with associated frontal sinus headache. She has tried sudaphed and Tussionex. Symptoms are aggravated with bending forward. No fever or chills. Cough is dry, non-productive. No shortness of breath or wheezing. Mild fatigue. No chest pain or pressure.  Sick contacts: None Travel: Within the [place] to district of [place].  Past medical history, social history, medications, and allergies were reviewed and are up to date in the chart. Allergies  Allergen Reactions  . Codeine Nausea And Vomiting    Review of Systems 7 point review of systems was performed and was otherwise negative unless noted in the history of present illness.     Objective:   Physical Exam BP 108/62 mmHg  Pulse 84  Temp(Src) 98.4 F (36.9 C) (Oral)  Resp 18  Ht 4\' 11"  (1.499 m)  Wt 114 lb (51.71 kg)  BMI 23.01 kg/m2  SpO2 96%  LMP 02/24/2015 General appearance: alert, cooperative and appears stated age Head: Normocephalic, without obvious abnormality, atraumatic Eyes: conjunctivae/corneas clear. PERRL, EOM's intact. Fundi benign. Ears: normal TM's and external ear canals both ears Nose: mucoid and purulent discharge, moderate congestion, turbinates red, swollen Throat: posterior pharyngeal erythema with post-nasal drip. No tonsillar enlargement or uvular deviation. Neck: mild anterior cervical adenopathy, no carotid bruit, no JVD, supple, symmetrical, trachea midline and thyroid not enlarged, symmetric, no tenderness/mass/nodules Lungs: clear to auscultation bilaterally Heart: regular rate and rhythm, S1, S2 normal, no murmur, click, rub or gallop Extremities: extremities normal,  atraumatic, no cyanosis or edema Pulses: 2+ and symmetric Skin: Skin color, texture, turgor normal. No rashes or lesions    Assessment & Plan:  1. Acute bacterial sinusitis  -Discussed supportive cares, warm humidified air, PO hydration, nasal saline irrigation. Tylenol and ibuprofen as needed for fever or body aches.  -Refilled Tussionex -Rx Z-pak -If noticing increasing cough, fever >102, shortness of breath, wheezing, lethargy, or for any other concerns, then return to the clinic or go to the emergency department. Patient verbalized understanding and agreement. -Follow-up if needed  Dr. Joellyn HaffPick-Jacobs, DO Sports Medicine Fellow

## 2015-05-12 ENCOUNTER — Encounter: Payer: Self-pay | Admitting: Internal Medicine

## 2015-05-12 ENCOUNTER — Ambulatory Visit (INDEPENDENT_AMBULATORY_CARE_PROVIDER_SITE_OTHER): Payer: Federal, State, Local not specified - PPO | Admitting: Internal Medicine

## 2015-05-12 ENCOUNTER — Encounter (INDEPENDENT_AMBULATORY_CARE_PROVIDER_SITE_OTHER): Payer: Self-pay

## 2015-05-12 VITALS — BP 108/62 | HR 80 | Ht 58.75 in | Wt 118.5 lb

## 2015-05-12 DIAGNOSIS — K5901 Slow transit constipation: Secondary | ICD-10-CM

## 2015-05-12 DIAGNOSIS — E039 Hypothyroidism, unspecified: Secondary | ICD-10-CM

## 2015-05-12 NOTE — Patient Instructions (Signed)
  We are going to be in contact with you about sitting you up an appointment at [facility] with Dr Caryl Ada.   Continue using your Dulcolax intermittently.  Finish the Amitiza you have and then stop that.   I appreciate the opportunity to care for you. Stan Head, M.D., [facility]

## 2015-05-12 NOTE — Assessment & Plan Note (Addendum)
Sitz marks show all markers in left colon at 5 days - splenic flexure to sigmoid even distribution Ano-rectal manometry ok 06/2014 TTG AB negative IgA NL 05/2014  Intermittent Dulcolax prn for now. Could possibly use colchicine but she is considering pregnancy and  Drop Amitiza when out See Dr. Andrey Campanile at [facility] for additional thoughts.

## 2015-05-14 ENCOUNTER — Encounter: Payer: Self-pay | Admitting: Internal Medicine

## 2015-05-14 NOTE — Assessment & Plan Note (Signed)
Will call her to see when last TSH checked - may need to be checked again

## 2015-05-14 NOTE — Progress Notes (Addendum)
Subjective:    Patient ID: Ann Rogers, female    DOB: February 21, 1976, 39 y.o.   MRN: [id number] Cc: constipation HPI The patient is here for f/u and tells me that she has been experiencing problems with constipation again after a period of time with more effective defecation using Linzess and Amitiza. Amitiza is also now costing $200/month. She says she is willing to pay for it but since not having bowel movements without a stimulant laxative like 2 dulcolax that she is likely to stop it. She is using dulcolax when she feels bloated and full. No other new medications. Has successfully banked frozen embryos and is hoping to get pregnant sometime by early 2017.  She remains busy with her job with [employer] and says that is a source of stress as always. Allergies  Allergen Reactions  . Codeine Nausea And Vomiting   Outpatient Prescriptions Prior to Visit  Medication Sig Dispense Refill  . levothyroxine (SYNTHROID, LEVOTHROID) 25 MCG tablet Take 1 tablet (25 mcg total) by mouth daily before breakfast. 90 tablet 3  . Linaclotide (LINZESS) 290 MCG CAPS capsule Take 290 mcg by mouth daily.    Marland Kitchen lubiprostone (AMITIZA) 24 MCG capsule Take 1 capsule (24 mcg total) by mouth 2 (two) times daily with a meal. 60 capsule 3  . azithromycin (ZITHROMAX) 250 MG tablet Take 2 tabs PO x 1 dose, then 1 tab PO QD x 4 days 6 tablet 0  . chlorpheniramine-HYDROcodone (TUSSIONEX PENNKINETIC ER) 10-8 MG/5ML LQCR Take 5 mLs by mouth 2 (two) times daily. 115 mL 0  . lidocaine (XYLOCAINE) 2 % solution Use 1 teaspoon every 2 hours to swish and swallow or spit as needed for pain 60 mL 0  . Vitamin D, Ergocalciferol, (DRISDOL) 50000 UNITS CAPS capsule Take 50,000 Units by mouth every 30 (thirty) days.     No facility-administered medications prior to visit.   Past Medical History  Diagnosis Date  . Hypothyroidism   . IBS (irritable bowel syndrome)   . Slow transit constipation 06/06/2014    She has MiraLax it  sounds like, she responds to intermittent Dulcolax, Linzess 290 mcg not helpful    Past Surgical History  Procedure Laterality Date  . Lasik  2013  . Egg preservation    . Anal rectal manometry N/A 07/01/2014    Procedure: ANAL RECTAL MANOMETRY;  Surgeon: Iva Boop, MD;  Location: [facility];  Service: Endoscopy;  Laterality: N/A;   History   Social History  . Marital Status: Widowed    Spouse Name: N/A  . Number of Children: 0  . Years of Education: N/A   Occupational History  . Chiropodist at [employer]   .     Social History Main Topics  . Smoking status: Never Smoker   . Smokeless tobacco: Never Used  . Alcohol Use: No  . Drug Use: No  . Sexual Activity: No   Other Topics Concern  . None   Social History Narrative   She is single, no children lives in [place]    [employer]   Regular exercise: no   Caffeine use: coffee in the am; 3-4 sodas daily   Last updated 06/06/2014            Family History  Problem Relation Age of Onset  . Stroke Mother   . Hypertension Father   . Hyperlipidemia Father   . Colon cancer Neg Hx   . Colon polyps  Neg Hx   . Esophageal cancer Neg Hx   . Kidney disease Neg Hx      Review of Systems As above    Objective:   Physical Exam BP 108/62 mmHg  Pulse 80  Ht 4' 10.75" (1.492 m)  Wt 118 lb 8 oz (53.751 kg)  BMI 24.15 kg/m2  LMP 04/18/2015     Assessment & Plan:  Slow transit constipation Sitz marks show all markers in left colon at 5 days - splenic flexure to sigmoid even distribution Ano-rectal manometry ok 06/2014 TTG AB negative IgA NL 05/2014  Intermittent Dulcolax prn for now. Could possibly use colchicine but she is considering pregnancy and  Drop Amitiza when out See Dr. Andrey Campanile at [facility] for additional thoughts.  Hypothyroidism Will call her to see when last TSH checked - may need to be checked again   15 minutes time spent with patient > half  in counseling coordination of care  I appreciate the opportunity to care for this patient.  Cc: Jarome Matin, MD

## 2015-05-20 ENCOUNTER — Telehealth: Payer: Self-pay

## 2015-05-20 NOTE — Telephone Encounter (Signed)
-----   Message from Iva Boop, MD sent at 05/14/2015  7:58 PM EDT ----- Regarding: refer to Dr. Caryl Ada [facility] Would like to refer her to Dr. Andrey Campanile re: chronic constipation BUT please ask patient when and where she last had TSH checked as if thyroid not being adequately treated that could be part of the problem.  If not done in last 6 months we should check that along with a free T4  Dx: hypothyroid

## 2015-05-20 NOTE — Telephone Encounter (Signed)
Patient notified She does see a endocrinologist and had her thyroid levels checked last November.  She does not want to come for a repeat.  Dr. Leone Payor notified She is aware that I will send records to [facility] for chronic constipation and that it will take several weeks for an appt to be scheduled.  She is notified that they will contact her directly for the appt date and time.

## 2015-06-13 ENCOUNTER — Telehealth: Payer: Self-pay | Admitting: Internal Medicine

## 2015-06-13 NOTE — Telephone Encounter (Signed)
I spoke with [facility] and they say they don't have the referral, I advised that I have a confirmation fax that it went through on 05/20/15.  She asked that I fax it again to attention Jasmine DecemberSharon [phone]  Patient notified of the status

## 2015-07-02 NOTE — Telephone Encounter (Signed)
She is schdeduled for 10/29/15 1:00 with [facility]

## 2015-07-15 ENCOUNTER — Ambulatory Visit (INDEPENDENT_AMBULATORY_CARE_PROVIDER_SITE_OTHER): Payer: Federal, State, Local not specified - PPO | Admitting: Physician Assistant

## 2015-07-15 VITALS — BP 112/64 | HR 86 | Temp 98.9°F | Resp 18 | Ht 59.5 in | Wt 120.2 lb

## 2015-07-15 DIAGNOSIS — J069 Acute upper respiratory infection, unspecified: Secondary | ICD-10-CM

## 2015-07-15 MED ORDER — IPRATROPIUM BROMIDE 0.03 % NA SOLN: 2.0000 | Freq: Two times a day (BID) | NASAL | Status: DC

## 2015-07-15 MED ORDER — HYDROCODONE-HOMATROPINE 5-1.5 MG/5ML PO SYRP: 5.0000 mL | ORAL_SOLUTION | Freq: Four times a day (QID) | ORAL | Status: DC | PRN

## 2015-07-15 MED ORDER — HYDROCODONE-CHLORPHENIRAMINE 5-4 MG/5ML PO SOLN: 5.0000 mL | Freq: Two times a day (BID) | ORAL | Status: DC | PRN

## 2015-07-15 NOTE — Addendum Note (Signed)
Addended by: Carmelina Dane on: 07/15/2015 05:24 PM   Modules accepted: Kipp Brood

## 2015-07-15 NOTE — Patient Instructions (Signed)
Drink plenty of water (64 oz/day) and get plenty of rest. If you have been prescribed a cough syrup, do not drive or operate heavy machinery while using this medication. If you have been prescribed a nasal spray, use twice a day. You may use sudafed OR mucinex but not both If your symptoms are not improving in 6-7 days, call and I will send in an antibiotic.

## 2015-07-15 NOTE — Addendum Note (Signed)
Addended by: Dorna Leitz on: 07/15/2015 10:13 AM   Modules accepted: Kipp Brood

## 2015-07-15 NOTE — Progress Notes (Addendum)
[facility] [address], [place] [address] [phone]  Date:  07/15/2015   Name:  Ann Rogers   DOB:  06-05-1976   MRN:  [id number]  PCP:  No PCP Per Patient    Chief Complaint: Nasal Congestion; Cough; Facial Pain; and Sore Throat   History of Present Illness:  This is a 39 y.o. female with PMH constipation, hypothyroidism who is presenting with nasal congestion, cough, sinus pressure and sore throat x 2 days.  Cough: dry. SOB/wheezing: no Nasal congestion: yes, yellow mucus Otalgia: no  Sore throat: yes Fever/chills: no but last night woke with night sweats. Aggravating/alleviating factors: sudafed, nyquil, aleve - no help. History of asthma: no History of env allergies: no Tobacco use: no Sick contacts: no  Pt is requesting tussionex to help with cough and sleep. She has n/v with hydrocodone tabs but has never had a problem with tussionex.  Review of Systems:  Review of Systems See HPI  Patient Active Problem List   Diagnosis Date Noted  . Slow transit constipation 06/06/2014  . Hypothyroidism 10/19/2013    Prior to Admission medications   Medication Sig Start Date End Date Taking? Authorizing Provider  levothyroxine (SYNTHROID, LEVOTHROID) 25 MCG tablet Take 1 tablet (25 mcg total) by mouth daily before breakfast. 10/24/14  Yes Carlus Pavlov, MD  Linaclotide (LINZESS) 290 MCG CAPS capsule Take 290 mcg by mouth daily.   Yes Historical Provider, MD  lubiprostone (AMITIZA) 24 MCG capsule Take 1 capsule (24 mcg total) by mouth 2 (two) times daily with a meal. 08/07/14  Yes Iva Boop, MD    Allergies  Allergen Reactions  . Codeine Nausea And Vomiting    Past Surgical History  Procedure Laterality Date  . Lasik  2013  . Egg preservation    . Anal rectal manometry N/A 07/01/2014    Procedure: ANAL RECTAL MANOMETRY;  Surgeon: Iva Boop, MD;  Location: [facility];  Service: Endoscopy;  Laterality: N/A;    Social  History  Substance Use Topics  . Smoking status: Never Smoker   . Smokeless tobacco: Never Used  . Alcohol Use: No    Family History  Problem Relation Age of Onset  . Stroke Mother   . Hypertension Father   . Hyperlipidemia Father   . Colon cancer Neg Hx   . Colon polyps Neg Hx   . Esophageal cancer Neg Hx   . Kidney disease Neg Hx     Medication list has been reviewed and updated.  Physical Examination:  Physical Exam  Constitutional: She is oriented to person, place, and time. She appears well-developed and well-nourished. No distress.  HENT:  Head: Normocephalic and atraumatic.  Right Ear: Hearing, tympanic membrane, external ear and ear canal normal.  Left Ear: Hearing, tympanic membrane, external ear and ear canal normal.  Nose: Mucosal edema present. Right sinus exhibits no maxillary sinus tenderness and no frontal sinus tenderness. Left sinus exhibits no maxillary sinus tenderness and no frontal sinus tenderness.  Mouth/Throat: Uvula is midline, oropharynx is clear and moist and mucous membranes are normal.  Eyes: Conjunctivae and lids are normal. Right eye exhibits no discharge. Left eye exhibits no discharge. No scleral icterus.  Cardiovascular: Normal rate, regular rhythm, normal heart sounds and normal pulses.   No murmur heard. Pulmonary/Chest: Effort normal and breath sounds normal. No respiratory distress. She has no wheezes. She has no rhonchi. She has no rales.  Musculoskeletal: Normal range of motion.  Lymphadenopathy:  Head (right side): No submental, no submandibular and no tonsillar adenopathy present.       Head (left side): No submental, no submandibular and no tonsillar adenopathy present.    She has no cervical adenopathy.  Neurological: She is alert and oriented to person, place, and time.  Skin: Skin is warm, dry and intact. No lesion and no rash noted.  Psychiatric: She has a normal mood and affect. Her speech is normal and behavior is normal.  Thought content normal.    BP 112/64 mmHg  Pulse 86  Temp(Src) 98.9 F (37.2 C) (Oral)  Resp 18  Ht 4' 11.5" (1.511 m)  Wt 120 lb 3.2 oz (54.522 kg)  BMI 23.88 kg/m2  SpO2 99%  LMP 07/07/2015  Assessment and Plan:  1. Viral URI Etiology likely viral, focus is on supportive care, see meds prescribed below. If she is not feeling better in 6-7 days, she will call and I will be happy to prescribe abx. - ipratropium (ATROVENT) 0.03 % nasal spray; Place 2 sprays into both nostrils 2 (two) times daily.  Dispense: 30 mL; Refill: 0 - Hydrocodone-Chlorpheniramine 5-4 MG/5ML SOLN; Take 5 mLs by mouth 2 (two) times daily as needed.  Dispense: 115 mL; Refill: 0   Stephenie Navejas V. Dyke Brackett, MHS [facility]  07/15/2015

## 2015-07-15 NOTE — Progress Notes (Signed)
  Medical screening examination/treatment/procedure(s) were performed by non-physician practitioner and as supervising physician I was immediately available for consultation/collaboration.     

## 2015-10-21 ENCOUNTER — Encounter: Payer: Self-pay | Admitting: Family Medicine

## 2015-10-21 ENCOUNTER — Ambulatory Visit (INDEPENDENT_AMBULATORY_CARE_PROVIDER_SITE_OTHER): Payer: Federal, State, Local not specified - PPO | Admitting: Internal Medicine

## 2015-10-21 ENCOUNTER — Encounter: Payer: Self-pay | Admitting: Internal Medicine

## 2015-10-21 VITALS — BP 102/64 | HR 79 | Temp 98.5°F | Resp 12 | Wt 119.0 lb

## 2015-10-21 DIAGNOSIS — E039 Hypothyroidism, unspecified: Secondary | ICD-10-CM | POA: Diagnosis not present

## 2015-10-21 LAB — TSH: TSH: 2.06 u[IU]/mL (ref 0.35–4.50)

## 2015-10-21 LAB — T4, FREE: Free T4: 1.07 ng/dL (ref 0.60–1.60)

## 2015-10-21 NOTE — Patient Instructions (Signed)
Please stop at the lab.  Continue levothyroxine 25 g daily.  Please return in 1 year.

## 2015-10-21 NOTE — Progress Notes (Signed)
Patient ID: Ann Rogers, female   DOB: 10/31/1976, 39 y.o.   MRN: [id number]   HPI  Ann Rogers is a 39 y.o.-year-old female, returning for f/u for hypothyroidism, dx 07/2013. Last visit 1 year ago.  Reviewed hx: Pt. has been dx with hypothyroidism in 07/2013 (TSH high, but pt not told how high, and records not available) >> started on Levothyroxine 25 mcg.  She takes the LT4: - fasting - with water - no b'fast - prenatal MVI at night - no calcium - no iron - no PPIs  Latest labs: Lab Results  Component Value Date   TSH 1.784 10/23/2014   TSH 0.59 05/13/2014   TSH 1.51 01/23/2014   TSH 0.94 10/19/2013   FREET4 1.25 10/23/2014   FREET4 1.00 05/13/2014   FREET4 1.03 01/23/2014   FREET4 0.94 10/19/2013   . Thyroid Peroxidase Antibody 10/19/2013 10.6  <35.0 IU/mL Final  Thyroid peroxidase antibodies not high, so likely not Hashimoto thyroiditis.   Pt denies feeling nodules in neck, hoarseness, dysphagia/odynophagia, SOB with lying down.  She c/o: - no weight loss/gain - + fatigue  - + constipation, very bothersome (has a stool every 2 weeks) >> saw Dr Leone PayorGessner >> started Linzess and Amitiza >> these stopped working >> will see [facility]  - + feeling cold - persistent - no dry skin - no hair falling - no problems with concentration - no depression  ROS: Constitutional: see above Eyes: no blurry vision, no xerophthalmia ENT: no sore throat, no nodules palpated in throat, no dysphagia/odynophagia, no hoarseness Cardiovascular: no CP/SOB/palpitations/leg swelling Respiratory: no cough/SOB Gastrointestinal: no N/V/D/C Musculoskeletal: no muscle/joint aches Skin: no rashes Neurological: no tremors/numbness/tingling/dizziness  I reviewed pt's medications, allergies, PMH, social hx, family hx, and changes were documented in the history of present illness. Otherwise, unchanged from my initial visit note.  PE: BP 102/64 mmHg  Pulse 79  Temp(Src) 98.5 F (36.9  C) (Oral)  Resp 12  Wt 119 lb (53.978 kg)  SpO2 97% Body mass index is 23.64 kg/(m^2). Wt Readings from Last 3 Encounters:  10/21/15 119 lb (53.978 kg)  07/15/15 120 lb 3.2 oz (54.522 kg)  05/12/15 118 lb 8 oz (53.751 kg)   Constitutional: normal weight, in NAD Eyes: PERRLA, EOMI, no exophthalmos ENT: moist mucous membranes, no thyromegaly, no cervical lymphadenopathy Cardiovascular: RRR, No MRG Respiratory: CTA B Gastrointestinal: abdomen soft, NT, ND, BS+ Musculoskeletal: no deformities, strength intact in all 4 Skin: moist, warm, no rashes Neurological: + mild tremor with outstretched hands, DTR normal in all 4  ASSESSMENT: 1. Hypothyroidism  PLAN:  1. Patient with mild hypothyroidism, on levothyroxine (LT4) therapy 25 mcg - takes it correctly. She appears euthyroid. - we reviewed her latest thyroid tests - all normal - 09/2014 - we will recheck TSH, free t4 today - continue LT4 25 mcg for now - Last year she was telling me she wanted to get pregnant (IVF), but she changed her mind for now - will let me know if she wants to get pregnant >> in that case, we need to aim for a TSH <2.5 - I advised her to come back in 09/2016 or right before the pregnancy, whichever is sooner  - She needs a refill on LT4 when labs are back.  Office Visit on 10/21/2015  Component Date Value Ref Range Status  . Free T4 10/21/2015 1.07  0.60 - 1.60 ng/dL Final  . TSH 40/98/119111/22/2016 2.06  0.35 - 4.50 uIU/mL Final

## 2015-10-22 ENCOUNTER — Ambulatory Visit (INDEPENDENT_AMBULATORY_CARE_PROVIDER_SITE_OTHER): Payer: Federal, State, Local not specified - PPO | Admitting: Family Medicine

## 2015-10-22 VITALS — BP 118/82 | HR 89 | Temp 98.5°F | Resp 20 | Ht 60.0 in | Wt 119.8 lb

## 2015-10-22 DIAGNOSIS — R05 Cough: Secondary | ICD-10-CM | POA: Diagnosis not present

## 2015-10-22 DIAGNOSIS — R059 Cough, unspecified: Secondary | ICD-10-CM

## 2015-10-22 DIAGNOSIS — R112 Nausea with vomiting, unspecified: Secondary | ICD-10-CM

## 2015-10-22 MED ORDER — ONDANSETRON 8 MG PO TBDP: 8.0000 mg | ORAL_TABLET | Freq: Three times a day (TID) | ORAL | Status: DC | PRN

## 2015-10-22 MED ORDER — HYDROCOD POLST-CPM POLST ER 10-8 MG/5ML PO SUER: 5.0000 mL | Freq: Two times a day (BID) | ORAL | Status: DC | PRN

## 2015-10-22 MED ORDER — LEVOTHYROXINE SODIUM 25 MCG PO TABS: 25.0000 ug | ORAL_TABLET | Freq: Every day | ORAL | Status: DC

## 2015-10-22 NOTE — Patient Instructions (Signed)

## 2015-10-22 NOTE — Progress Notes (Signed)
This chart was scribed for Ann Haber, MD by Moises Blood, medical scribe at [facility].The patient was seen in exam room 14 and the patient's care was started at 6:08 PM.  Patient ID: Ann Rogers MRN: [id number], DOB: 01-15-1976, 39 y.o. Date of Encounter: 10/22/2015  Primary Physician: Vic Blackbird, MD  Chief Complaint:  Chief Complaint  Patient presents with  . Sore Throat  . Fever  . Emesis  . Cough    HPI:  Ann Rogers is a 39 y.o. female who presents to [facility] complaining of illness that started 2 days ago. Last night, she noticed cough with sore throat and couldn't sleep well last night. This morning, she felt a headache with nausea and had an episode of vomiting (3:30PM today) She states that she can't keep anything down due to appetite loss. She still feels nauseous in the room. She denies dizziness.   She's Surveyor, quantity at [employer].   Past Medical History  Diagnosis Date  . Hypothyroidism   . IBS (irritable bowel syndrome)   . Slow transit constipation 06/06/2014    She has MiraLax it sounds like, she responds to intermittent Dulcolax, Linzess 290 mcg not helpful      Home Meds: Prior to Admission medications   Medication Sig Start Date End Date Taking? Authorizing Provider  levothyroxine (SYNTHROID, LEVOTHROID) 25 MCG tablet Take 1 tablet (25 mcg total) by mouth daily before breakfast. 10/22/15  Yes Philemon Kingdom, MD  Linaclotide (LINZESS) 290 MCG CAPS capsule Take 290 mcg by mouth daily.   Yes Historical Provider, MD  lubiprostone (AMITIZA) 24 MCG capsule Take 1 capsule (24 mcg total) by mouth 2 (two) times daily with a meal. 08/07/14  Yes Gatha Mayer, MD  Prenat w/o A-FeCbn-Meth-FA-DHA (OB COMPLETE GOLD) 27.5-1-200 MG CAPS Take 1 capsule by mouth daily. 10/17/15  Yes Historical Provider, MD  Sulfacetamide-Sulfur-Cleanser (SUMADAN) 9-4.5 % KIT USE TO [place] FACE 1 TO 2 TIMES DAILY  09/14/15  Yes Historical Provider, MD    Allergies:  Allergies  Allergen Reactions  . Codeine Nausea And Vomiting    Social History   Social History  . Marital Status: Widowed    Spouse Name: N/A  . Number of Children: 0  . Years of Education: N/A   Occupational History  . Surveyor, quantity at [employer]   .     Social History Main Topics  . Smoking status: Never Smoker   . Smokeless tobacco: Never Used  . Alcohol Use: No  . Drug Use: No  . Sexual Activity: Not Currently   Other Topics Concern  . Not on file   Social History Narrative   She is single, no children lives in [place]   Regular exercise: no   Caffeine use: coffee in the am; 3-4 sodas daily   Last updated 06/06/2014              Review of Systems: Constitutional: negative for chills, night sweats, weight changes, or fatigue; positive for fever, appetite loss HEENT: negative for vision changes, hearing loss, congestion, rhinorrhea, epistaxis, or sinus pressure; positive for sore throat Cardiovascular: negative for chest pain or palpitations Respiratory: negative for hemoptysis, wheezing, shortness of breath; positive for cough Abdominal: negative for abdominal pain, diarrhea, or constipation; positive for vomiting, nausea Dermatological: negative for rash Neurologic: negative for dizziness, or syncope; positive for headache All other systems reviewed and  are otherwise negative with the exception to those above and in the HPI.  Physical Exam: Blood pressure 118/82, pulse 89, temperature 98.5 F (36.9 C), temperature source Oral, resp. rate 20, height 5' (1.524 m), weight 119 lb 12.8 oz (54.341 kg), last menstrual period 10/22/2015, SpO2 98 %., Body mass index is 23.4 kg/(m^2). General: Well developed, well nourished, in no acute distress. Head: Normocephalic, atraumatic, eyes without discharge, sclera non-icteric, nares are without  discharge. Bilateral auditory canals clear, TM's are without perforation, pearly grey and translucent with reflective cone of light bilaterally. Oral cavity moist, posterior pharynx without exudate, erythema, peritonsillar abscess, or post nasal drip. Neck: Supple. No thyromegaly. Full ROM. No lymphadenopathy. Lungs: Clear bilaterally to auscultation without wheezes, rales, or rhonchi. Breathing is unlabored. Heart: RRR with S1 S2. No murmurs, rubs, or gallops appreciated. Abdomen: Soft, non-tender, non-distended with normoactive bowel sounds. No hepatomegaly. No rebound/guarding. No obvious abdominal masses. Msk:  Strength and tone normal for age. Extremities/Skin: Warm and dry. No clubbing or cyanosis. No edema. No rashes or suspicious lesions. Neuro: Alert and oriented X 3. Moves all extremities spontaneously. Gait is normal. CNII-XII grossly in tact. Psych:  Responds to questions appropriately with a normal affect.    ASSESSMENT AND PLAN:  39 y.o. year old female with  This chart was scribed in my presence and reviewed by me personally.    ICD-9-CM ICD-10-CM   1. Cough 786.2 R05 chlorpheniramine-HYDROcodone (TUSSIONEX PENNKINETIC ER) 10-8 MG/5ML SUER  2. Nausea and vomiting, intractability of vomiting not specified, unspecified vomiting type 787.01 R11.2 ondansetron (ZOFRAN-ODT) 8 MG disintegrating tablet      By signing my name below, I, Moises Blood, attest that this documentation has been prepared under the direction and in the presence of Ann Haber, MD. Electronically Signed: Moises Blood, [place]. 10/22/2015 , 6:08 PM .  Signed, Ann Haber, MD 10/22/2015 6:08 PM

## 2015-10-24 ENCOUNTER — Ambulatory Visit: Payer: Federal, State, Local not specified - PPO | Admitting: Internal Medicine

## 2015-11-04 ENCOUNTER — Ambulatory Visit (INDEPENDENT_AMBULATORY_CARE_PROVIDER_SITE_OTHER): Payer: Federal, State, Local not specified - PPO | Admitting: Family Medicine

## 2015-11-04 ENCOUNTER — Encounter: Payer: Self-pay | Admitting: Family Medicine

## 2015-11-04 VITALS — BP 112/68 | HR 76 | Temp 98.3°F | Resp 14 | Ht 60.0 in | Wt 118.0 lb

## 2015-11-04 DIAGNOSIS — Z23 Encounter for immunization: Secondary | ICD-10-CM | POA: Diagnosis not present

## 2015-11-04 DIAGNOSIS — E039 Hypothyroidism, unspecified: Secondary | ICD-10-CM | POA: Diagnosis not present

## 2015-11-04 DIAGNOSIS — Z0000 Encounter for general adult medical examination without abnormal findings: Secondary | ICD-10-CM | POA: Diagnosis not present

## 2015-11-04 DIAGNOSIS — K5901 Slow transit constipation: Secondary | ICD-10-CM | POA: Diagnosis not present

## 2015-11-04 MED ORDER — LINACLOTIDE 290 MCG PO CAPS: 290.0000 ug | ORAL_CAPSULE | Freq: Every day | ORAL | Status: DC

## 2015-11-04 MED ORDER — LUBIPROSTONE 24 MCG PO CAPS: 24.0000 ug | ORAL_CAPSULE | Freq: Two times a day (BID) | ORAL | Status: DC

## 2015-11-04 NOTE — Patient Instructions (Addendum)
Release of records- [facility]/ Dr. Jarold MottoPatterson  TDAP given today  Return for fasting labs  F/U as needed

## 2015-11-04 NOTE — Progress Notes (Signed)
Patient ID: Ann Rogers, female   DOB: 12/12/1975, 39 y.o.   MRN: [id number]   Subjective:    Patient ID: Ann Fettersomeika L Dunaway, female    DOB: 05/17/1976, 39 y.o.   MRN: [id number]  Patient presents for New Patient CPE  is here for new patient physical. Reviewed her past medical history and family history. As was her medications. She is being followed by Dr. Atha StarksGreywall/GYn. Her Pap smears up-to-date. She has had her flu shot. She is due for tetanus booster.  She is history of chronic constipation she was being followed by Dr. Leone PayorGessner she [facility] for further evaluation and treatment. She has had constipation since her teenage years which is quite severe. She's tried multiple laxatives over-the-counter and prescription she is currently on Linzess and Amitza A she will have a bowel movement only a couple times a week. Reviewed [facility] note, they recommened work up for Celiac or bacterial overgrowth.  His history of hypothyroidism this was found on routine labs after she states that she felt cold all the time. She did not have any other thyroid symptoms. She is followed by endocrinology for this. I reviewed her last notes.  Dr. 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 [employer]  She is single, but would like to have child in next few years  Review Of Systems:  GEN- denies fatigue, fever, weight loss,weakness, recent illness HEENT- denies eye drainage, change in vision, nasal discharge, CVS- denies chest pain, palpitations RESP- denies SOB, cough, wheeze ABD- denies N/V, change in stools, abd pain GU- denies dysuria, hematuria, dribbling, incontinence MSK- denies joint pain, muscle aches, injury Neuro- denies headache, dizziness, syncope, seizure activity       Objective:    BP 112/68 mmHg  Pulse 76  Temp(Src) 98.3 F (36.8 C) (Oral)  Resp 14  Ht 5' (1.524 m)  Wt 118 lb (53.524 kg)  BMI 23.05 kg/m2  LMP 10/22/2015  (Approximate) GEN- NAD, alert and oriented x3 HEENT- PERRL, EOMI, non injected sclera, pink conjunctiva, MMM, oropharynx clear Neck- Supple, no thyromegaly CVS- RRR, no murmur RESP-CTAB ABD-NABS,soft,NT,ND Psych- normal affect and mood, very energetic EXT- No edema Pulses- Radial, DP- 2+        Assessment & Plan:      Problem List Items Addressed This Visit    Slow transit constipation    Given new scripts for Linzess and Amitza F/U with GI      Hypothyroidism    Reviewed Endocrinology note       Other Visit Diagnoses    Routine general medical examination at a health care facility    -  Primary    CPE done, TDAP given, return for fasting labs    Relevant Orders    CBC with Differential/Platelet    Comprehensive metabolic panel    Lipid panel       Note: This dictation was prepared with Dragon dictation along with smaller phrase technology. Any transcriptional errors that result from this process are unintentional.

## 2015-11-04 NOTE — Assessment & Plan Note (Signed)
Given new scripts for Linzess and Amitza F/U with GI

## 2015-11-04 NOTE — Assessment & Plan Note (Signed)
Reviewed Endocrinology note

## 2015-11-11 ENCOUNTER — Other Ambulatory Visit: Payer: Federal, State, Local not specified - PPO

## 2015-11-11 LAB — COMPREHENSIVE METABOLIC PANEL
ALBUMIN: 3.8 g/dL (ref 3.6–5.1)
ALK PHOS: 38 U/L (ref 33–115)
ALT: 40 U/L — AB (ref 6–29)
AST: 29 U/L (ref 10–30)
BUN: 10 mg/dL (ref 7–25)
CHLORIDE: 107 mmol/L (ref 98–110)
CO2: 24 mmol/L (ref 20–31)
Calcium: 8.6 mg/dL (ref 8.6–10.2)
Creat: 0.78 mg/dL (ref 0.50–1.10)
Glucose, Bld: 77 mg/dL (ref 70–99)
POTASSIUM: 4.4 mmol/L (ref 3.5–5.3)
Sodium: 139 mmol/L (ref 135–146)
TOTAL PROTEIN: 6.4 g/dL (ref 6.1–8.1)
Total Bilirubin: 0.5 mg/dL (ref 0.2–1.2)

## 2015-11-11 LAB — LIPID PANEL
CHOL/HDL RATIO: 2.6 ratio (ref <=5.0)
CHOLESTEROL: 185 mg/dL (ref 125–200)
HDL: 72 mg/dL (ref >=46)
LDL Cholesterol: 102 mg/dL (ref <130)
TRIGLYCERIDES: 55 mg/dL (ref <150)
VLDL: 11 mg/dL (ref <30)

## 2015-11-11 LAB — CBC WITH DIFFERENTIAL/PLATELET
BASOS ABS: 0.1 10*3/uL (ref 0.0–0.1)
Basophils Relative: 1 % (ref 0–1)
Eosinophils Absolute: 0.2 10*3/uL (ref 0.0–0.7)
Eosinophils Relative: 3 % (ref 0–5)
HEMATOCRIT: 39.3 % (ref 36.0–46.0)
HEMOGLOBIN: 12.8 g/dL (ref 12.0–15.0)
LYMPHS ABS: 2.7 10*3/uL (ref 0.7–4.0)
LYMPHS PCT: 46 % (ref 12–46)
MCH: 29.1 pg (ref 26.0–34.0)
MCHC: 32.6 g/dL (ref 30.0–36.0)
MCV: 89.3 fL (ref 78.0–100.0)
MONOS PCT: 10 % (ref 3–12)
MPV: 9.7 fL (ref 8.6–12.4)
Monocytes Absolute: 0.6 10*3/uL (ref 0.1–1.0)
NEUTROS ABS: 2.3 10*3/uL (ref 1.7–7.7)
NEUTROS PCT: 40 % — AB (ref 43–77)
PLATELETS: 234 10*3/uL (ref 150–400)
RBC: 4.4 MIL/uL (ref 3.87–5.11)
RDW: 13 % (ref 11.5–15.5)
WBC: 5.8 10*3/uL (ref 4.0–10.5)

## 2015-11-12 ENCOUNTER — Other Ambulatory Visit: Payer: Self-pay | Admitting: *Deleted

## 2015-11-12 DIAGNOSIS — R945 Abnormal results of liver function studies: Principal | ICD-10-CM

## 2015-11-12 DIAGNOSIS — R7989 Other specified abnormal findings of blood chemistry: Secondary | ICD-10-CM

## 2015-12-03 ENCOUNTER — Telehealth: Payer: Self-pay | Admitting: Internal Medicine

## 2015-12-03 NOTE — Telephone Encounter (Signed)
[facility] with Dr. Kelly SplinterFeiler.  They released her and advised she follow up here.  Dr. Leone PayorGessner have you seen the eval.  She is asking what the next step is?  Notes are in CareEverywhere?  Do I need to set her up for follow up?

## 2015-12-03 NOTE — Telephone Encounter (Signed)
I saw the note - They thought she ought to have a colonoscopy  Ask her to see me and we can discuss that.

## 2015-12-04 NOTE — Telephone Encounter (Signed)
Patient notified Follow up scheduled for 01/22/16

## 2015-12-17 ENCOUNTER — Ambulatory Visit (INDEPENDENT_AMBULATORY_CARE_PROVIDER_SITE_OTHER): Payer: Federal, State, Local not specified - PPO | Admitting: Internal Medicine

## 2015-12-17 ENCOUNTER — Encounter: Payer: Self-pay | Admitting: Internal Medicine

## 2015-12-17 VITALS — BP 112/64 | HR 74 | Temp 97.7°F | Resp 12 | Wt 115.8 lb

## 2015-12-17 DIAGNOSIS — E039 Hypothyroidism, unspecified: Secondary | ICD-10-CM

## 2015-12-17 LAB — TSH: TSH: 1.74 u[IU]/mL (ref 0.35–4.50)

## 2015-12-17 LAB — T4, FREE: Free T4: 1.05 ng/dL (ref 0.60–1.60)

## 2015-12-17 NOTE — Patient Instructions (Signed)
Please stop at the lab.  We will need another set of thyroid tests 1 month into the pregnancy.  Keep the appt in 09/2016.

## 2015-12-17 NOTE — Progress Notes (Signed)
Patient ID: Ann Rogers, female   DOB: 1976/02/11, 40 y.o.   MRN: [id number]   HPI  Ann Rogers is a 40 y.o.-year-old female, returning for f/u for hypothyroidism, dx 07/2013. Last visit 2 mo ago.  She is contemplating pregnancy >> 1st appt 12/2015. Will see Dr April Manson.   Reviewed hx: Pt. has been dx with hypothyroidism in 07/2013 (TSH high, but pt not told how high, and records not available) >> started on Levothyroxine 25 mcg.  She takes the LT4: - fasting - with water - no b'fast - prenatal MVI at night - no calcium - no iron - no PPIs  Latest labs: Lab Results  Component Value Date   TSH 2.06 10/21/2015   TSH 1.784 10/23/2014   TSH 0.59 05/13/2014   TSH 1.51 01/23/2014   TSH 0.94 10/19/2013   FREET4 1.07 10/21/2015   FREET4 1.25 10/23/2014   FREET4 1.00 05/13/2014   FREET4 1.03 01/23/2014   FREET4 0.94 10/19/2013   . Thyroid Peroxidase Antibody 10/19/2013 10.6  <35.0 IU/mL Final  Thyroid peroxidase antibodies not high, so likely not Hashimoto thyroiditis.   Pt denies feeling nodules in neck, hoarseness, dysphagia/odynophagia, SOB with lying down.  She c/o: - no weight loss/gain - no fatigue  - + constipation, very bothersome (has a stool every 2 weeks) >> saw Dr Leone Payor >> started Linzess and Amitiza >> these stopped working >> saw [facility] >> suggested to go back to [employer] >> she does not want this >> will see Dr Leone Payor again - + feeling cold - persistent - no dry skin - no hair falling - no problems with concentration - no depression  ROS: Constitutional: see above Eyes: no blurry vision, no xerophthalmia ENT: no sore throat, no nodules palpated in throat, no dysphagia/odynophagia, no hoarseness Cardiovascular: no CP/SOB/palpitations/leg swelling Respiratory: no cough/SOB Gastrointestinal: no N/V/D/C Musculoskeletal: no muscle/joint aches Skin: no rashes Neurological: no tremors/numbness/tingling/dizziness  I reviewed pt's  medications, allergies, PMH, social hx, family hx, and changes were documented in the history of present illness. Otherwise, unchanged from my initial visit note.  PE: BP 112/64 mmHg  Pulse 74  Temp(Src) 97.7 F (36.5 C) (Oral)  Resp 12  Wt 115 lb 12.8 oz (52.527 kg)  SpO2 98% Body mass index is 22.62 kg/(m^2). Wt Readings from Last 3 Encounters:  12/17/15 115 lb 12.8 oz (52.527 kg)  11/04/15 118 lb (53.524 kg)  10/22/15 119 lb 12.8 oz (54.341 kg)   Constitutional: normal weight, in NAD Eyes: PERRLA, EOMI, no exophthalmos ENT: moist mucous membranes, no thyromegaly, no cervical lymphadenopathy Cardiovascular: RRR, No MRG Respiratory: CTA B Gastrointestinal: abdomen soft, NT, ND, BS+ Musculoskeletal: no deformities, strength intact in all 4 Skin: moist, warm, no rashes Neurological: no tremor with outstretched hands, DTR normal in all 4  ASSESSMENT: 1. Hypothyroidism  PLAN:  1. Patient with mild hypothyroidism, on levothyroxine (LT4) therapy 25 mcg - takes it correctly. She appears euthyroid. - we reviewed her latest thyroid tests - all normal - 09/2015 - we will recheck TSH, free t4 as she is trying to conceive >> explained targets for the TSH during pregnancy - will aim for <2.5 in the first trimester, <3 in the second, and <3.5 in the 3rd. - continue LT4 25 mcg for now - I advised her to come back for labs 1 mo into pregnancy  Office Visit on 12/17/2015  Component Date Value Ref Range Status  . TSH 12/17/2015 1.74  0.35 - 4.50 uIU/mL Final  .  Free T4 12/17/2015 1.05  0.60 - 1.60 ng/dL Final   Excellent TFTs. Continue LT4 25 mcg and will repeat TFTs at 1 mo into the pregnancy.

## 2015-12-18 ENCOUNTER — Other Ambulatory Visit: Payer: Federal, State, Local not specified - PPO

## 2015-12-18 DIAGNOSIS — R945 Abnormal results of liver function studies: Principal | ICD-10-CM

## 2015-12-18 DIAGNOSIS — R7989 Other specified abnormal findings of blood chemistry: Secondary | ICD-10-CM

## 2015-12-18 LAB — COMPLETE METABOLIC PANEL WITH GFR
ALT: 73 U/L — AB (ref 6–29)
AST: 39 U/L — ABNORMAL HIGH (ref 10–30)
Albumin: 3.9 g/dL (ref 3.6–5.1)
Alkaline Phosphatase: 43 U/L (ref 33–115)
BUN: 12 mg/dL (ref 7–25)
CHLORIDE: 105 mmol/L (ref 98–110)
CO2: 26 mmol/L (ref 20–31)
Calcium: 9.1 mg/dL (ref 8.6–10.2)
Creat: 0.65 mg/dL (ref 0.50–1.10)
GFR, Est African American: >89 mL/min (ref >=60)
GLUCOSE: 95 mg/dL (ref 70–99)
Potassium: 4.2 mmol/L (ref 3.5–5.3)
SODIUM: 139 mmol/L (ref 135–146)
TOTAL PROTEIN: 6.7 g/dL (ref 6.1–8.1)
Total Bilirubin: 0.4 mg/dL (ref 0.2–1.2)

## 2015-12-19 ENCOUNTER — Other Ambulatory Visit: Payer: Self-pay | Admitting: *Deleted

## 2015-12-19 ENCOUNTER — Other Ambulatory Visit: Payer: Federal, State, Local not specified - PPO

## 2015-12-19 DIAGNOSIS — R945 Abnormal results of liver function studies: Principal | ICD-10-CM

## 2015-12-19 DIAGNOSIS — R7989 Other specified abnormal findings of blood chemistry: Secondary | ICD-10-CM

## 2015-12-20 LAB — HEPATITIS PANEL, ACUTE
HCV Ab: NEGATIVE
Hep A IgM: NONREACTIVE
Hep B C IgM: NONREACTIVE
Hepatitis B Surface Ag: NEGATIVE

## 2015-12-24 ENCOUNTER — Other Ambulatory Visit: Payer: Self-pay | Admitting: *Deleted

## 2015-12-24 ENCOUNTER — Ambulatory Visit
Admission: RE | Admit: 2015-12-24 | Discharge: 2015-12-24 | Disposition: A | Discharge status: Home / Self Care | Payer: Federal, State, Local not specified - PPO | Source: Ambulatory Visit | Attending: Family Medicine | Admitting: Family Medicine

## 2015-12-24 DIAGNOSIS — R7989 Other specified abnormal findings of blood chemistry: Secondary | ICD-10-CM

## 2015-12-24 DIAGNOSIS — R945 Abnormal results of liver function studies: Principal | ICD-10-CM

## 2016-01-08 ENCOUNTER — Other Ambulatory Visit: Payer: Federal, State, Local not specified - PPO

## 2016-01-08 DIAGNOSIS — R945 Abnormal results of liver function studies: Principal | ICD-10-CM

## 2016-01-08 DIAGNOSIS — R7989 Other specified abnormal findings of blood chemistry: Secondary | ICD-10-CM

## 2016-01-08 LAB — COMPLETE METABOLIC PANEL WITH GFR
ALBUMIN: 4 g/dL (ref 3.6–5.1)
ALK PHOS: 39 U/L (ref 33–115)
ALT: 24 U/L (ref 6–29)
AST: 20 U/L (ref 10–30)
BILIRUBIN TOTAL: 0.5 mg/dL (ref 0.2–1.2)
BUN: 13 mg/dL (ref 7–25)
CO2: 23 mmol/L (ref 20–31)
CREATININE: 0.88 mg/dL (ref 0.50–1.10)
Calcium: 8.8 mg/dL (ref 8.6–10.2)
Chloride: 104 mmol/L (ref 98–110)
GFR, Est African American: >89 mL/min (ref >=60)
GFR, Est Non African American: 84 mL/min (ref >=60)
GLUCOSE: 80 mg/dL (ref 70–99)
Potassium: 4.1 mmol/L (ref 3.5–5.3)
SODIUM: 139 mmol/L (ref 135–146)
TOTAL PROTEIN: 6.9 g/dL (ref 6.1–8.1)

## 2016-01-22 ENCOUNTER — Ambulatory Visit (INDEPENDENT_AMBULATORY_CARE_PROVIDER_SITE_OTHER): Payer: Federal, State, Local not specified - PPO | Admitting: Internal Medicine

## 2016-01-22 ENCOUNTER — Encounter: Payer: Self-pay | Admitting: Internal Medicine

## 2016-01-22 VITALS — BP 118/70 | HR 72 | Ht 60.0 in | Wt 117.6 lb

## 2016-01-22 DIAGNOSIS — R740 Nonspecific elevation of levels of transaminase and lactic acid dehydrogenase [LDH]: Secondary | ICD-10-CM

## 2016-01-22 DIAGNOSIS — K5901 Slow transit constipation: Secondary | ICD-10-CM

## 2016-01-22 DIAGNOSIS — R748 Abnormal levels of other serum enzymes: Secondary | ICD-10-CM | POA: Insufficient documentation

## 2016-01-22 NOTE — Progress Notes (Signed)
   Subjective:    Patient ID: Ann Rogers, female    DOB: 13-Feb-1976, 40 y.o.   MRN: [id number] Cc: Constipation HPI The patient is here, for follow-up. Been a while since I have seen her. She was [facility] at [facility], but by Dr. Kelly Splinter not Dr. Andrey Campanile. Apparently she couldn't get in with Dr. Andrey Campanile. Dr. Kelly Splinter suggested considering flexible sigmoidoscopy. Biofeedback was also a consideration for chronic constipation. At this point she is off Amitiza and Linzess. She is anticipating in vitro fertilization, in April. She is using Dulcolax by mouth intermittently and it works but it's unpredictable how long after it is taken that it will produce bowel movements. She is probably going to restart using MiraLAX also. She says that if she gets the urge to defecate at work she'll just hold it, she does not like to go there. She is not miserable but she would prefer to have more regular defecation that is predictable. She had some abnormal transaminases with a negative ultrasound and negative hepatitis panel. Her primary care doctor thought it might of been from Amitiza. These have resolved. Medications, allergies, past medical history, past surgical history, family history and social history are reviewed and updated in the EMR.  Review of Systems As above    Objective:   Physical Exam BP 118/70 mmHg  Pulse 72  Ht 5' (1.524 m)  Wt 117 lb 9.6 oz (53.343 kg)  BMI 22.97 kg/m2  LMP 01/16/2016 (Exact Date) No Acute distress       Assessment & Plan:  Slow transit constipation Bisacodyl and MiraLax will be used though Amitiza and Linzess seem ok in pregnancy from what I see I can understand trying to avoid those.  See me prn or now. Hopefully her pregnancy will be successful and healthy. Consider elevate floor physical therapy and biofeedback after pregnancy - no she does seem to have slow transit constipation she did have some possible pelvic floor dysfunction suspected on her anorectal manometry and  she also will put off going to defecation at work etc. which suggest retraining could be helpful. Cc Grewal  Abnormal transaminases - resolved Although it's possible Amitiza could've caused this I suspect it was something else. At any rate it has resolved. I don't think it would preclude retrying the Amitiza at some point.  15 minutes time spent with patient > half in counseling coordination of care

## 2016-01-22 NOTE — Patient Instructions (Signed)
  Keep using your dulcolax and you may add miralax if needed.    Follow up with Dr Leone Payor as needed.    I appreciate the opportunity to care for you. Stan Head, MD, [facility]

## 2016-01-22 NOTE — Assessment & Plan Note (Addendum)
Bisacodyl and MiraLax will be used though Amitiza and Linzess seem ok in pregnancy from what I see I can understand trying to avoid those.  See me prn or now. Hopefully her pregnancy will be successful and healthy. Consider elevate floor physical therapy and biofeedback after pregnancy - no she does seem to have slow transit constipation she did have some possible pelvic floor dysfunction suspected on her anorectal manometry and she also will put off going to defecation at work etc. which suggest retraining could be helpful. Cc Grewal

## 2016-01-22 NOTE — Assessment & Plan Note (Signed)
Although it's possible Amitiza could've caused this I suspect it was something else. At any rate it has resolved. I don't think it would preclude retrying the Amitiza at some point.

## 2016-03-18 DIAGNOSIS — Z319 Encounter for procreative management, unspecified: Secondary | ICD-10-CM | POA: Diagnosis not present

## 2016-03-23 DIAGNOSIS — Z3189 Encounter for other procreative management: Secondary | ICD-10-CM | POA: Diagnosis not present

## 2016-04-12 DIAGNOSIS — Z3169 Encounter for other general counseling and advice on procreation: Secondary | ICD-10-CM | POA: Diagnosis not present

## 2016-04-12 DIAGNOSIS — N978 Female infertility of other origin: Secondary | ICD-10-CM | POA: Diagnosis not present

## 2016-04-13 ENCOUNTER — Telehealth: Payer: Self-pay | Admitting: Family Medicine

## 2016-04-13 ENCOUNTER — Other Ambulatory Visit: Payer: Self-pay | Admitting: *Deleted

## 2016-04-13 DIAGNOSIS — E039 Hypothyroidism, unspecified: Secondary | ICD-10-CM

## 2016-04-13 DIAGNOSIS — Z13220 Encounter for screening for lipoid disorders: Secondary | ICD-10-CM

## 2016-04-13 DIAGNOSIS — Z0000 Encounter for general adult medical examination without abnormal findings: Secondary | ICD-10-CM

## 2016-04-13 DIAGNOSIS — Z1321 Encounter for screening for nutritional disorder: Secondary | ICD-10-CM

## 2016-04-13 NOTE — Telephone Encounter (Signed)
Okay to place order, Make sure there was nothing else of concern

## 2016-04-13 NOTE — Telephone Encounter (Signed)
Call placed to patient.   Requested to have routine labs performed as well.   I have already ordered this as a future order in the computer.

## 2016-04-13 NOTE — Telephone Encounter (Signed)
Pt is requesting for orders to be put in for her to have lab work to check her vitamin D levels. She would like to have the lab work only, does not want to be seen unless necessary. [phone]

## 2016-04-13 NOTE — Telephone Encounter (Signed)
MD please advise

## 2016-04-14 ENCOUNTER — Other Ambulatory Visit: Payer: Self-pay

## 2016-04-15 ENCOUNTER — Other Ambulatory Visit: Payer: Federal, State, Local not specified - PPO

## 2016-04-15 DIAGNOSIS — Z13220 Encounter for screening for lipoid disorders: Secondary | ICD-10-CM | POA: Diagnosis not present

## 2016-04-15 DIAGNOSIS — E039 Hypothyroidism, unspecified: Secondary | ICD-10-CM

## 2016-04-15 DIAGNOSIS — E289 Ovarian dysfunction, unspecified: Secondary | ICD-10-CM | POA: Diagnosis not present

## 2016-04-15 DIAGNOSIS — Z1159 Encounter for screening for other viral diseases: Secondary | ICD-10-CM | POA: Diagnosis not present

## 2016-04-15 DIAGNOSIS — Z1321 Encounter for screening for nutritional disorder: Secondary | ICD-10-CM

## 2016-04-15 DIAGNOSIS — Z0000 Encounter for general adult medical examination without abnormal findings: Secondary | ICD-10-CM

## 2016-04-15 DIAGNOSIS — Z3169 Encounter for other general counseling and advice on procreation: Secondary | ICD-10-CM | POA: Diagnosis not present

## 2016-04-15 LAB — COMPLETE METABOLIC PANEL WITH GFR
ALBUMIN: 4 g/dL (ref 3.6–5.1)
ALK PHOS: 44 U/L (ref 33–115)
ALT: 26 U/L (ref 6–29)
AST: 23 U/L (ref 10–30)
BUN: 13 mg/dL (ref 7–25)
CALCIUM: 8.6 mg/dL (ref 8.6–10.2)
CHLORIDE: 101 mmol/L (ref 98–110)
CO2: 24 mmol/L (ref 20–31)
Creat: 0.68 mg/dL (ref 0.50–1.10)
GFR, Est African American: >89 mL/min (ref >=60)
Glucose, Bld: 77 mg/dL (ref 70–99)
POTASSIUM: 4 mmol/L (ref 3.5–5.3)
SODIUM: 138 mmol/L (ref 135–146)
Total Bilirubin: 0.6 mg/dL (ref 0.2–1.2)
Total Protein: 6.8 g/dL (ref 6.1–8.1)

## 2016-04-15 LAB — CBC WITH DIFFERENTIAL/PLATELET
BASOS ABS: 57 {cells}/uL (ref 0–200)
Basophils Relative: 1 %
EOS PCT: 1 %
Eosinophils Absolute: 57 cells/uL (ref 15–500)
HCT: 37.8 % (ref 35.0–45.0)
Hemoglobin: 12.4 g/dL (ref 12.0–15.0)
LYMPHS PCT: 35 %
Lymphs Abs: 1995 cells/uL (ref 850–3900)
MCH: 28.8 pg (ref 27.0–33.0)
MCHC: 32.8 g/dL (ref 32.0–36.0)
MCV: 87.7 fL (ref 80.0–100.0)
MPV: 9.7 fL (ref 7.5–12.5)
Monocytes Absolute: 342 cells/uL (ref 200–950)
Monocytes Relative: 6 %
NEUTROS PCT: 57 %
Neutro Abs: 3249 cells/uL (ref 1500–7800)
Platelets: 281 10*3/uL (ref 140–400)
RBC: 4.31 MIL/uL (ref 3.80–5.10)
RDW: 13.2 % (ref 11.0–15.0)
WBC: 5.7 10*3/uL (ref 3.8–10.8)

## 2016-04-15 LAB — LIPID PANEL
CHOL/HDL RATIO: 2.7 ratio (ref <=5.0)
Cholesterol: 199 mg/dL (ref 125–200)
HDL: 75 mg/dL (ref >=46)
LDL CALC: 117 mg/dL (ref <130)
Triglycerides: 37 mg/dL (ref <150)
VLDL: 7 mg/dL (ref <30)

## 2016-04-15 LAB — TSH: TSH: 1.3 mIU/L

## 2016-04-16 LAB — VITAMIN D 25 HYDROXY (VIT D DEFICIENCY, FRACTURES): VIT D 25 HYDROXY: 27 ng/mL — AB (ref 30–100)

## 2016-05-04 DIAGNOSIS — M9902 Segmental and somatic dysfunction of thoracic region: Secondary | ICD-10-CM | POA: Diagnosis not present

## 2016-05-04 DIAGNOSIS — M9901 Segmental and somatic dysfunction of cervical region: Secondary | ICD-10-CM | POA: Diagnosis not present

## 2016-05-04 DIAGNOSIS — M791 Myalgia: Secondary | ICD-10-CM | POA: Diagnosis not present

## 2016-05-04 DIAGNOSIS — G540 Brachial plexus disorders: Secondary | ICD-10-CM | POA: Diagnosis not present

## 2016-05-06 DIAGNOSIS — M9901 Segmental and somatic dysfunction of cervical region: Secondary | ICD-10-CM | POA: Diagnosis not present

## 2016-05-06 DIAGNOSIS — M791 Myalgia: Secondary | ICD-10-CM | POA: Diagnosis not present

## 2016-05-06 DIAGNOSIS — G540 Brachial plexus disorders: Secondary | ICD-10-CM | POA: Diagnosis not present

## 2016-05-06 DIAGNOSIS — M9902 Segmental and somatic dysfunction of thoracic region: Secondary | ICD-10-CM | POA: Diagnosis not present

## 2016-05-10 DIAGNOSIS — M791 Myalgia: Secondary | ICD-10-CM | POA: Diagnosis not present

## 2016-05-10 DIAGNOSIS — M9901 Segmental and somatic dysfunction of cervical region: Secondary | ICD-10-CM | POA: Diagnosis not present

## 2016-05-10 DIAGNOSIS — G540 Brachial plexus disorders: Secondary | ICD-10-CM | POA: Diagnosis not present

## 2016-05-10 DIAGNOSIS — M9902 Segmental and somatic dysfunction of thoracic region: Secondary | ICD-10-CM | POA: Diagnosis not present

## 2016-05-12 DIAGNOSIS — Z3169 Encounter for other general counseling and advice on procreation: Secondary | ICD-10-CM | POA: Diagnosis not present

## 2016-05-13 DIAGNOSIS — M791 Myalgia: Secondary | ICD-10-CM | POA: Diagnosis not present

## 2016-05-13 DIAGNOSIS — G540 Brachial plexus disorders: Secondary | ICD-10-CM | POA: Diagnosis not present

## 2016-05-13 DIAGNOSIS — M9901 Segmental and somatic dysfunction of cervical region: Secondary | ICD-10-CM | POA: Diagnosis not present

## 2016-05-13 DIAGNOSIS — M9902 Segmental and somatic dysfunction of thoracic region: Secondary | ICD-10-CM | POA: Diagnosis not present

## 2016-05-17 DIAGNOSIS — G540 Brachial plexus disorders: Secondary | ICD-10-CM | POA: Diagnosis not present

## 2016-05-17 DIAGNOSIS — M9901 Segmental and somatic dysfunction of cervical region: Secondary | ICD-10-CM | POA: Diagnosis not present

## 2016-05-17 DIAGNOSIS — M9902 Segmental and somatic dysfunction of thoracic region: Secondary | ICD-10-CM | POA: Diagnosis not present

## 2016-05-17 DIAGNOSIS — M791 Myalgia: Secondary | ICD-10-CM | POA: Diagnosis not present

## 2016-05-19 DIAGNOSIS — Z3169 Encounter for other general counseling and advice on procreation: Secondary | ICD-10-CM | POA: Diagnosis not present

## 2016-05-31 DIAGNOSIS — Z3201 Encounter for pregnancy test, result positive: Secondary | ICD-10-CM | POA: Diagnosis not present

## 2016-05-31 DIAGNOSIS — E039 Hypothyroidism, unspecified: Secondary | ICD-10-CM | POA: Diagnosis not present

## 2016-06-02 DIAGNOSIS — Z3201 Encounter for pregnancy test, result positive: Secondary | ICD-10-CM | POA: Diagnosis not present

## 2016-06-15 DIAGNOSIS — O0900 Supervision of pregnancy with history of infertility, unspecified trimester: Secondary | ICD-10-CM | POA: Diagnosis not present

## 2016-06-16 DIAGNOSIS — K080 Exfoliation of teeth due to systemic causes: Secondary | ICD-10-CM | POA: Diagnosis not present

## 2016-07-02 DIAGNOSIS — L700 Acne vulgaris: Secondary | ICD-10-CM | POA: Diagnosis not present

## 2016-07-02 DIAGNOSIS — Z3169 Encounter for other general counseling and advice on procreation: Secondary | ICD-10-CM | POA: Diagnosis not present

## 2016-07-07 DIAGNOSIS — Z3491 Encounter for supervision of normal pregnancy, unspecified, first trimester: Secondary | ICD-10-CM | POA: Diagnosis not present

## 2016-07-07 DIAGNOSIS — Z3A09 9 weeks gestation of pregnancy: Secondary | ICD-10-CM | POA: Diagnosis not present

## 2016-07-07 DIAGNOSIS — Z36 Encounter for antenatal screening of mother: Secondary | ICD-10-CM | POA: Diagnosis not present

## 2016-07-07 LAB — OB RESULTS CONSOLE ABO/RH: RH TYPE: POSITIVE

## 2016-07-07 LAB — OB RESULTS CONSOLE HIV ANTIBODY (ROUTINE TESTING): HIV: NONREACTIVE

## 2016-07-07 LAB — OB RESULTS CONSOLE ANTIBODY SCREEN: Antibody Screen: NEGATIVE

## 2016-07-07 LAB — OB RESULTS CONSOLE RPR: RPR: NONREACTIVE

## 2016-07-07 LAB — OB RESULTS CONSOLE RUBELLA ANTIBODY, IGM: RUBELLA: IMMUNE

## 2016-07-07 LAB — OB RESULTS CONSOLE GC/CHLAMYDIA
Chlamydia: NEGATIVE
Gonorrhea: NEGATIVE

## 2016-07-07 LAB — OB RESULTS CONSOLE HEPATITIS B SURFACE ANTIGEN: Hepatitis B Surface Ag: NEGATIVE

## 2016-07-14 ENCOUNTER — Other Ambulatory Visit: Payer: Self-pay

## 2016-07-14 ENCOUNTER — Other Ambulatory Visit (INDEPENDENT_AMBULATORY_CARE_PROVIDER_SITE_OTHER): Payer: Federal, State, Local not specified - PPO

## 2016-07-14 DIAGNOSIS — Z3401 Encounter for supervision of normal first pregnancy, first trimester: Secondary | ICD-10-CM | POA: Diagnosis not present

## 2016-07-14 DIAGNOSIS — E039 Hypothyroidism, unspecified: Secondary | ICD-10-CM

## 2016-07-14 DIAGNOSIS — Z36 Encounter for antenatal screening of mother: Secondary | ICD-10-CM | POA: Diagnosis not present

## 2016-07-14 DIAGNOSIS — Z113 Encounter for screening for infections with a predominantly sexual mode of transmission: Secondary | ICD-10-CM | POA: Diagnosis not present

## 2016-07-14 DIAGNOSIS — Z3A10 10 weeks gestation of pregnancy: Secondary | ICD-10-CM | POA: Diagnosis not present

## 2016-07-14 LAB — T4, FREE: FREE T4: 1.15 ng/dL (ref 0.60–1.60)

## 2016-07-14 LAB — TSH: TSH: 1.55 u[IU]/mL (ref 0.35–4.50)

## 2016-07-15 ENCOUNTER — Telehealth: Payer: Self-pay

## 2016-07-15 NOTE — Telephone Encounter (Signed)
Called patient regarding lab results. Patient would like Dr.Gherghe to know she is pregnant and OB put her on 75 mcg of synthroid, also when she needed to be seen as patient requested Dr.Gherghe follow her thyroid issue.

## 2016-07-20 ENCOUNTER — Telehealth: Payer: Self-pay

## 2016-07-20 DIAGNOSIS — E039 Hypothyroidism, unspecified: Secondary | ICD-10-CM

## 2016-07-20 NOTE — Telephone Encounter (Signed)
Called patient regarding lab results. Patient would like Dr.Gherghe to know she is pregnant and OB put her on 75 mcg of synthroid, also when she needed to be seen as patient requested Dr.Gherghe follow her thyroid issue. Please advise. Thank you!

## 2016-07-20 NOTE — Telephone Encounter (Signed)
I saw this message 2 days ago and I actually reply to eat, but I do not see that in Epic. Congratulations! I would like to repeat the TSH and a free T4 in 1 month after she started this dose. I can have her back for an appointment in 3 months.

## 2016-07-21 NOTE — Telephone Encounter (Signed)
Notified pt. Scheduled lab appt for 08/03/16 as she started medicatiion on 07/02/16. Future lab order entered.  Pt already has 1 year follow up on 11/21/7 which will be 3 months from now and will keep appt as scheduled.

## 2016-07-28 DIAGNOSIS — Z36 Encounter for antenatal screening of mother: Secondary | ICD-10-CM | POA: Diagnosis not present

## 2016-08-03 ENCOUNTER — Other Ambulatory Visit: Payer: Federal, State, Local not specified - PPO

## 2016-08-04 ENCOUNTER — Other Ambulatory Visit (INDEPENDENT_AMBULATORY_CARE_PROVIDER_SITE_OTHER): Payer: Federal, State, Local not specified - PPO

## 2016-08-04 DIAGNOSIS — E039 Hypothyroidism, unspecified: Secondary | ICD-10-CM

## 2016-08-04 LAB — T4, FREE: Free T4: 1.06 ng/dL (ref 0.60–1.60)

## 2016-08-04 LAB — TSH: TSH: 1.51 u[IU]/mL (ref 0.35–4.50)

## 2016-08-06 ENCOUNTER — Telehealth: Payer: Self-pay

## 2016-08-06 ENCOUNTER — Telehealth: Payer: Self-pay | Admitting: Internal Medicine

## 2016-08-06 NOTE — Telephone Encounter (Signed)
Pt calling inquiring about her lab results.  Requests call back.

## 2016-08-06 NOTE — Telephone Encounter (Signed)
Called patient and gave lab results. Patient had no questions or concerns.  

## 2016-08-11 DIAGNOSIS — O281 Abnormal biochemical finding on antenatal screening of mother: Secondary | ICD-10-CM | POA: Diagnosis not present

## 2016-08-30 ENCOUNTER — Telehealth: Payer: Self-pay | Admitting: Internal Medicine

## 2016-08-30 NOTE — Telephone Encounter (Signed)
Her labs were great 1 mo ago. I believe that these were drawn on 50 g daily. Can you please confirm with the patient? I am not sure why the dose was increased to 75? When was this done? Was she on this for him at least a month, if so, we need to recheck the tests and then I will agree to refill it. In the future, we'll need to be on the same page regarding her thyroid dosing.

## 2016-08-30 NOTE — Telephone Encounter (Signed)
Pt called and needs her Levothyroxine refilled, she said that her fertility doctor bumped her up to 75 MCG and that she is on her last of this and they request that her endocrinologist sent her refill.   [facility]

## 2016-08-31 ENCOUNTER — Telehealth: Payer: Self-pay

## 2016-08-31 ENCOUNTER — Other Ambulatory Visit: Payer: Self-pay

## 2016-08-31 MED ORDER — LEVOTHYROXINE SODIUM 75 MCG PO TABS: 75.0000 ug | ORAL_TABLET | Freq: Every day | ORAL | 1 refills | Status: DC

## 2016-08-31 NOTE — Telephone Encounter (Signed)
OK, no pb! Let's refill that.

## 2016-08-31 NOTE — Telephone Encounter (Signed)
Called patient she states that when she had her lab draws before she was taking the 75 mcg dose already, her fertility doctor put her on that the 5th of July. Patient states she is no longer seeing that fertility doctor, and told her OB that you will be covering her thyroid medication from now on. Patient states that she comes for labs on 09/06/16, is there anway we can fill the 75 mcg dose for a month supply and then change if need be after the lab draws as she is about to run out of the medication and only has one pill left. Please advise, thank you!

## 2016-08-31 NOTE — Telephone Encounter (Signed)
Sent in 75 mcg dose to pharmacy, and will see patient for labs. Called and notified patient.

## 2016-09-03 ENCOUNTER — Other Ambulatory Visit: Payer: Self-pay | Admitting: Internal Medicine

## 2016-09-03 DIAGNOSIS — E039 Hypothyroidism, unspecified: Secondary | ICD-10-CM

## 2016-09-03 DIAGNOSIS — Z713 Dietary counseling and surveillance: Secondary | ICD-10-CM | POA: Diagnosis not present

## 2016-09-06 ENCOUNTER — Other Ambulatory Visit (INDEPENDENT_AMBULATORY_CARE_PROVIDER_SITE_OTHER): Payer: Federal, State, Local not specified - PPO

## 2016-09-06 DIAGNOSIS — Z23 Encounter for immunization: Secondary | ICD-10-CM | POA: Diagnosis not present

## 2016-09-06 DIAGNOSIS — Z3A18 18 weeks gestation of pregnancy: Secondary | ICD-10-CM | POA: Diagnosis not present

## 2016-09-06 DIAGNOSIS — E039 Hypothyroidism, unspecified: Secondary | ICD-10-CM

## 2016-09-06 DIAGNOSIS — Z3480 Encounter for supervision of other normal pregnancy, unspecified trimester: Secondary | ICD-10-CM | POA: Diagnosis not present

## 2016-09-06 DIAGNOSIS — Z363 Encounter for antenatal screening for malformations: Secondary | ICD-10-CM | POA: Diagnosis not present

## 2016-09-06 LAB — TSH: TSH: 0.83 u[IU]/mL (ref 0.35–4.50)

## 2016-09-06 LAB — T4, FREE: Free T4: 0.92 ng/dL (ref 0.60–1.60)

## 2016-09-07 ENCOUNTER — Telehealth: Payer: Self-pay

## 2016-09-07 NOTE — Telephone Encounter (Signed)
Called patient and gave lab results. Patient had no questions or concerns.  

## 2016-10-01 DIAGNOSIS — Z713 Dietary counseling and surveillance: Secondary | ICD-10-CM | POA: Diagnosis not present

## 2016-10-19 ENCOUNTER — Encounter: Payer: Self-pay | Admitting: Internal Medicine

## 2016-10-19 ENCOUNTER — Ambulatory Visit (INDEPENDENT_AMBULATORY_CARE_PROVIDER_SITE_OTHER): Payer: Federal, State, Local not specified - PPO | Admitting: Internal Medicine

## 2016-10-19 VITALS — BP 118/78 | HR 81 | Ht 59.0 in | Wt 129.0 lb

## 2016-10-19 DIAGNOSIS — E039 Hypothyroidism, unspecified: Secondary | ICD-10-CM

## 2016-10-19 LAB — TSH: TSH: 1.56 u[IU]/mL (ref 0.35–4.50)

## 2016-10-19 LAB — T4, FREE: FREE T4: 0.86 ng/dL (ref 0.60–1.60)

## 2016-10-19 NOTE — Patient Instructions (Signed)
Please stop at the lab.  Continue Levothyroxine 75 mcg daily.  Please come back for labs in 3 months and for a visit in 5 months (1 mo after you give birth).

## 2016-10-19 NOTE — Progress Notes (Signed)
Patient ID: Ann Rogers, female   DOB: 08/11/1976, 40 y.o.   MRN: [id number]   HPI  Ann Rogers is a 40 y.o.-year-old female, returning for f/u for hypothyroidism, dx 07/2013. Last visit 10 mo ago.  At last visit, she was contemplating pregnancy >> saw Dr April MansonYalcinkaya, then [facility] >> now pregnant - 25 weeks. She will have a girl. Due date 02/04/2017.  She will move in a new house in 2 week.  Reviewed hx: Pt. has been dx with hypothyroidism in 07/2013 (TSH high, but pt not told how high, and records not available) >> started on Levothyroxine 25 mcg.  Since she got pregnant, LT4 dose was increased to 75 mcg daily.  She takes the LT4: - fasting - with water - no b'fast - prenatal MVI at night - no calcium - no iron - no PPIs  Latest labs: Lab Results  Component Value Date   TSH 0.83 09/06/2016   TSH 1.51 08/04/2016   TSH 1.55 07/14/2016   TSH 1.30 04/15/2016   TSH 1.74 12/17/2015   FREET4 0.92 09/06/2016   FREET4 1.06 08/04/2016   FREET4 1.15 07/14/2016   FREET4 1.05 12/17/2015   FREET4 1.07 10/21/2015   . Thyroid Peroxidase Antibody 10/19/2013 10.6  <35.0 IU/mL Final  Thyroid peroxidase antibodies not high, so likely not Hashimoto thyroiditis.   Pt denies feeling nodules in neck, hoarseness, dysphagia/odynophagia, SOB with lying down.  She c/o: - + weight gain - + fatigue  - + constipation >> saw Dr Leone PayorGessner >> started Linzess and Amitiza >> these stopped working >> saw [facility] >> suggested to go back to [employer]iralax >> she is doing much better on this - no dry skin - no hair falling - no problems with concentration - no depression  ROS: Constitutional: see above Eyes: no blurry vision, no xerophthalmia ENT: no sore throat, no nodules palpated in throat, no dysphagia/odynophagia, no hoarseness Cardiovascular: no CP/SOB/palpitations/leg swelling Respiratory: no cough/SOB Gastrointestinal: no N/V/D/+ C Musculoskeletal: no muscle/joint  aches Skin: no rashes Neurological: no tremors/numbness/tingling/dizziness  I reviewed pt's medications, allergies, PMH, social hx, family hx, and changes were documented in the history of present illness. Otherwise, unchanged from my initial visit note.  PE: BP 118/78   Pulse 81   Ht 4\' 11"  (1.499 m)   Wt 129 lb (58.5 kg)   LMP 01/16/2016 (Exact Date)   SpO2 95%   BMI 26.05 kg/m  Body mass index is 26.05 kg/m. Wt Readings from Last 3 Encounters:  10/19/16 129 lb (58.5 kg)  01/22/16 117 lb 9.6 oz (53.3 kg)  12/17/15 115 lb 12.8 oz (52.5 kg)   Constitutional: pregnant appearing, in NAD Eyes: PERRLA, EOMI, no exophthalmos ENT: moist mucous membranes, no thyromegaly, no cervical lymphadenopathy Cardiovascular: RRR, No MRG Respiratory: CTA B Gastrointestinal: abdomen soft, NT, ND, BS+ Musculoskeletal: no deformities, strength intact in all 4 Skin: moist, warm, no rashes Neurological: no tremor with outstretched hands, DTR normal in all 4  ASSESSMENT: 1. Hypothyroidism - in pregnancy  PLAN:  1. Patient with mild hypothyroidism, on levothyroxine (LT4) therapy 75 mcg - she is now pregnant 2nd trimester - She appears euthyroid - we reviewed her latest thyroid tests - all normal - reviewed targets for the TSH during pregnancy - will aim for <2.5 in the first trimester, <3 in the second, and <3.5 in the 3rd. - continue LT4 75 mcg for now - RTC in 5 mo, but for labs in 3 mo  Needs  refills.  Office Visit on 10/19/2016  Component Date Value Ref Range Status  . TSH 10/19/2016 1.56  0.35 - 4.50 uIU/mL Final  . Free T4 10/19/2016 0.86  0.60 - 1.60 ng/dL Final   Comment: Specimens from patients who are undergoing biotin therapy and /or ingesting biotin supplements may contain high levels of biotin.  The higher biotin concentration in these specimens interferes with this Free T4 assay.  Specimens that contain high levels  of biotin may cause false high results for this Free T4 assay.   Please interpret results in light of the total clinical presentation of the patient.     Labs are great!  Carlus Pavlovristina Kelten Enochs, MD PhD [facility]

## 2016-10-20 ENCOUNTER — Other Ambulatory Visit: Payer: Self-pay

## 2016-10-20 MED ORDER — LEVOTHYROXINE SODIUM 75 MCG PO TABS: 75.0000 ug | ORAL_TABLET | Freq: Every day | ORAL | 5 refills | Status: DC

## 2016-11-04 DIAGNOSIS — Z3480 Encounter for supervision of other normal pregnancy, unspecified trimester: Secondary | ICD-10-CM | POA: Diagnosis not present

## 2016-11-09 DIAGNOSIS — O99810 Abnormal glucose complicating pregnancy: Secondary | ICD-10-CM | POA: Diagnosis not present

## 2016-11-09 DIAGNOSIS — Z23 Encounter for immunization: Secondary | ICD-10-CM | POA: Diagnosis not present

## 2016-11-16 DIAGNOSIS — Z36 Encounter for antenatal screening for chromosomal anomalies: Secondary | ICD-10-CM | POA: Diagnosis not present

## 2016-11-25 DIAGNOSIS — K080 Exfoliation of teeth due to systemic causes: Secondary | ICD-10-CM | POA: Diagnosis not present

## 2016-11-29 NOTE — L&D Delivery Note (Signed)
Operative Delivery Note At 1:32 PM a viable female was delivered via Vaginal, Vacuum Investment banker, operational(Extractor).  Presentation: vertex; Position: Right,, Occiput,, Anterior; Station: +3.  Verbal consent: obtained from patient.  Risks and benefits discussed in detail.  Risks include, but are not limited to the risks of anesthesia, bleeding, infection, damage to maternal tissues, fetal cephalhematoma.  There is also the risk of inability to effect vaginal delivery of the head, or shoulder dystocia that cannot be resolved by established maneuvers, leading to the need for emergency cesarean section.  APGAR: 9, 9; weight pending .   Placenta status: SHORT CORD MOST LIKELY THE CAUSE OF DECELERATIONS delivered spontaneously and noted to be intact   Cord:  with the following complications: .  Cord pH: not obtained  Anesthesia:  epidural Instruments: Mushroom  Episiotomy: none  Lacerations:  2nd Suture Repair: 3.0 chromic Est. Blood Loss (mL):  300  Mom to postpartum.  Baby to Couplet care / Skin to Skin.  Ann Rogers 01/21/2017, 1:46 PM

## 2016-12-01 DIAGNOSIS — Z36 Encounter for antenatal screening for chromosomal anomalies: Secondary | ICD-10-CM | POA: Diagnosis not present

## 2017-01-04 DIAGNOSIS — Z36 Encounter for antenatal screening for chromosomal anomalies: Secondary | ICD-10-CM | POA: Diagnosis not present

## 2017-01-04 DIAGNOSIS — Z3480 Encounter for supervision of other normal pregnancy, unspecified trimester: Secondary | ICD-10-CM | POA: Diagnosis not present

## 2017-01-07 ENCOUNTER — Other Ambulatory Visit: Payer: Self-pay

## 2017-01-07 DIAGNOSIS — E039 Hypothyroidism, unspecified: Secondary | ICD-10-CM

## 2017-01-14 LAB — OB RESULTS CONSOLE GBS: STREP GROUP B AG: POSITIVE

## 2017-01-18 DIAGNOSIS — Z3A37 37 weeks gestation of pregnancy: Secondary | ICD-10-CM | POA: Diagnosis not present

## 2017-01-18 DIAGNOSIS — O365930 Maternal care for other known or suspected poor fetal growth, third trimester, not applicable or unspecified: Secondary | ICD-10-CM | POA: Diagnosis not present

## 2017-01-19 ENCOUNTER — Other Ambulatory Visit: Payer: Federal, State, Local not specified - PPO

## 2017-01-19 ENCOUNTER — Telehealth (HOSPITAL_COMMUNITY): Payer: Self-pay | Admitting: *Deleted

## 2017-01-19 ENCOUNTER — Encounter (HOSPITAL_COMMUNITY): Payer: Self-pay | Admitting: *Deleted

## 2017-01-19 NOTE — Telephone Encounter (Signed)
Preadmission screen  

## 2017-01-20 ENCOUNTER — Inpatient Hospital Stay (HOSPITAL_COMMUNITY)
Admission: AD | Admit: 2017-01-20 | Discharge: 2017-01-23 | DRG: 775 | Disposition: A | Discharge status: Home / Self Care | Payer: Federal, State, Local not specified - PPO | Source: Ambulatory Visit | Attending: Obstetrics and Gynecology | Admitting: Obstetrics and Gynecology

## 2017-01-20 ENCOUNTER — Encounter (HOSPITAL_COMMUNITY): Payer: Self-pay

## 2017-01-20 DIAGNOSIS — Z3490 Encounter for supervision of normal pregnancy, unspecified, unspecified trimester: Secondary | ICD-10-CM

## 2017-01-20 DIAGNOSIS — O99824 Streptococcus B carrier state complicating childbirth: Secondary | ICD-10-CM | POA: Diagnosis not present

## 2017-01-20 DIAGNOSIS — O365930 Maternal care for other known or suspected poor fetal growth, third trimester, not applicable or unspecified: Principal | ICD-10-CM | POA: Diagnosis present

## 2017-01-20 DIAGNOSIS — Z8249 Family history of ischemic heart disease and other diseases of the circulatory system: Secondary | ICD-10-CM

## 2017-01-20 DIAGNOSIS — Z3A38 38 weeks gestation of pregnancy: Secondary | ICD-10-CM

## 2017-01-20 DIAGNOSIS — E039 Hypothyroidism, unspecified: Secondary | ICD-10-CM

## 2017-01-20 DIAGNOSIS — Z23 Encounter for immunization: Secondary | ICD-10-CM | POA: Diagnosis not present

## 2017-01-20 LAB — CBC
HEMATOCRIT: 29.6 % — AB (ref 36.0–46.0)
Hemoglobin: 10.5 g/dL — ABNORMAL LOW (ref 12.0–15.0)
MCH: 30.3 pg (ref 26.0–34.0)
MCHC: 35.5 g/dL (ref 30.0–36.0)
MCV: 85.3 fL (ref 78.0–100.0)
Platelets: 191 10*3/uL (ref 150–400)
RBC: 3.47 MIL/uL — AB (ref 3.87–5.11)
RDW: 13 % (ref 11.5–15.5)
WBC: 10.3 10*3/uL (ref 4.0–10.5)

## 2017-01-20 LAB — TYPE AND SCREEN
ABO/RH(D): A POS
Antibody Screen: NEGATIVE

## 2017-01-20 MED ORDER — MISOPROSTOL 25 MCG QUARTER TABLET
25.0000 ug | ORAL_TABLET | ORAL | Status: DC
  Administered 2017-01-20 – 2017-01-21 (×3): 25 ug via VAGINAL
  Filled 2017-01-20: qty 0.25
  Filled 2017-01-20 (×5): qty 1
  Filled 2017-01-20 (×2): qty 0.25
  Filled 2017-01-20: qty 1

## 2017-01-20 MED ORDER — LIDOCAINE HCL (PF) 1 % IJ SOLN
30.0000 mL | INTRAMUSCULAR | Status: DC | PRN
  Filled 2017-01-20: qty 30

## 2017-01-20 MED ORDER — OXYTOCIN 40 UNITS IN LACTATED RINGERS INFUSION - SIMPLE MED
2.5000 [IU]/h | INTRAVENOUS | Status: DC
  Administered 2017-01-21: 2.5 [IU]/h via INTRAVENOUS
  Filled 2017-01-20: qty 1000

## 2017-01-20 MED ORDER — OXYCODONE-ACETAMINOPHEN 5-325 MG PO TABS: 2.0000 | ORAL_TABLET | ORAL | Status: DC | PRN

## 2017-01-20 MED ORDER — LACTATED RINGERS IV SOLN
500.0000 mL | INTRAVENOUS | Status: DC | PRN
  Administered 2017-01-21: 500 mL via INTRAVENOUS

## 2017-01-20 MED ORDER — ACETAMINOPHEN 325 MG PO TABS: 650.0000 mg | ORAL_TABLET | ORAL | Status: DC | PRN

## 2017-01-20 MED ORDER — SOD CITRATE-CITRIC ACID 500-334 MG/5ML PO SOLN: 30.0000 mL | ORAL | Status: DC | PRN

## 2017-01-20 MED ORDER — ZOLPIDEM TARTRATE 5 MG PO TABS
5.0000 mg | ORAL_TABLET | Freq: Every evening | ORAL | Status: DC | PRN
  Administered 2017-01-20: 5 mg via ORAL
  Filled 2017-01-20: qty 1

## 2017-01-20 MED ORDER — LACTATED RINGERS IV SOLN
INTRAVENOUS | Status: DC
  Administered 2017-01-20: 21:00:00 via INTRAVENOUS

## 2017-01-20 MED ORDER — PENICILLIN G POTASSIUM 5000000 UNITS IJ SOLR
5.0000 10*6.[IU] | Freq: Once | INTRAVENOUS | Status: AC
  Administered 2017-01-21: 5 10*6.[IU] via INTRAVENOUS
  Filled 2017-01-20: qty 5

## 2017-01-20 MED ORDER — OXYCODONE-ACETAMINOPHEN 5-325 MG PO TABS: 1.0000 | ORAL_TABLET | ORAL | Status: DC | PRN

## 2017-01-20 MED ORDER — ONDANSETRON HCL 4 MG/2ML IJ SOLN
4.0000 mg | Freq: Four times a day (QID) | INTRAMUSCULAR | Status: DC | PRN
  Administered 2017-01-21: 4 mg via INTRAVENOUS
  Filled 2017-01-20: qty 2

## 2017-01-20 MED ORDER — OXYTOCIN BOLUS FROM INFUSION
500.0000 mL | Freq: Once | INTRAVENOUS | Status: AC
  Administered 2017-01-21: 500 mL via INTRAVENOUS

## 2017-01-20 MED ORDER — FLEET ENEMA 7-19 GM/118ML RE ENEM: 1.0000 | ENEMA | Freq: Every day | RECTAL | Status: DC | PRN

## 2017-01-20 MED ORDER — PENICILLIN G POT IN DEXTROSE 60000 UNIT/ML IV SOLN
3.0000 10*6.[IU] | INTRAVENOUS | Status: DC
  Administered 2017-01-21: 3 10*6.[IU] via INTRAVENOUS
  Filled 2017-01-20 (×8): qty 50

## 2017-01-21 ENCOUNTER — Inpatient Hospital Stay (HOSPITAL_COMMUNITY): Payer: Federal, State, Local not specified - PPO | Admitting: Anesthesiology

## 2017-01-21 ENCOUNTER — Inpatient Hospital Stay (HOSPITAL_COMMUNITY): Admission: RE | Admit: 2017-01-21 | Payer: Federal, State, Local not specified - PPO | Source: Ambulatory Visit

## 2017-01-21 ENCOUNTER — Encounter (HOSPITAL_COMMUNITY): Payer: Self-pay

## 2017-01-21 LAB — ABO/RH: ABO/RH(D): A POS

## 2017-01-21 LAB — RPR: RPR: NONREACTIVE

## 2017-01-21 MED ORDER — DIBUCAINE 1 % RE OINT: 1.0000 "application " | TOPICAL_OINTMENT | RECTAL | Status: DC | PRN

## 2017-01-21 MED ORDER — DIPHENHYDRAMINE HCL 25 MG PO CAPS: 25.0000 mg | ORAL_CAPSULE | Freq: Four times a day (QID) | ORAL | Status: DC | PRN

## 2017-01-21 MED ORDER — MEASLES, MUMPS & RUBELLA VAC ~~LOC~~ INJ
0.5000 mL | INJECTION | Freq: Once | SUBCUTANEOUS | Status: DC
  Filled 2017-01-21: qty 0.5

## 2017-01-21 MED ORDER — WITCH HAZEL-GLYCERIN EX PADS: 1.0000 "application " | MEDICATED_PAD | CUTANEOUS | Status: DC | PRN

## 2017-01-21 MED ORDER — BISACODYL 10 MG RE SUPP: 10.0000 mg | Freq: Every day | RECTAL | Status: DC | PRN

## 2017-01-21 MED ORDER — LIDOCAINE HCL (PF) 1 % IJ SOLN
INTRAMUSCULAR | Status: DC | PRN
  Administered 2017-01-21: 3 mL via EPIDURAL
  Administered 2017-01-21: 2 mL via EPIDURAL
  Administered 2017-01-21: 5 mL via EPIDURAL

## 2017-01-21 MED ORDER — ZOLPIDEM TARTRATE 5 MG PO TABS: 5.0000 mg | ORAL_TABLET | Freq: Every evening | ORAL | Status: DC | PRN

## 2017-01-21 MED ORDER — MEDROXYPROGESTERONE ACETATE 150 MG/ML IM SUSP: 150.0000 mg | INTRAMUSCULAR | Status: DC | PRN

## 2017-01-21 MED ORDER — LACTATED RINGERS IV SOLN
500.0000 mL | Freq: Once | INTRAVENOUS | Status: AC
  Administered 2017-01-21: 500 mL via INTRAVENOUS

## 2017-01-21 MED ORDER — EPHEDRINE 5 MG/ML INJ
10.0000 mg | INTRAVENOUS | Status: DC | PRN
  Filled 2017-01-21: qty 4

## 2017-01-21 MED ORDER — LEVOTHYROXINE SODIUM 75 MCG PO TABS
75.0000 ug | ORAL_TABLET | Freq: Every day | ORAL | Status: DC
  Administered 2017-01-21: 75 ug via ORAL
  Filled 2017-01-21 (×2): qty 1

## 2017-01-21 MED ORDER — FLEET ENEMA 7-19 GM/118ML RE ENEM: 1.0000 | ENEMA | Freq: Every day | RECTAL | Status: DC | PRN

## 2017-01-21 MED ORDER — TETANUS-DIPHTH-ACELL PERTUSSIS 5-2.5-18.5 LF-MCG/0.5 IM SUSP: 0.5000 mL | Freq: Once | INTRAMUSCULAR | Status: DC

## 2017-01-21 MED ORDER — IBUPROFEN 600 MG PO TABS
600.0000 mg | ORAL_TABLET | Freq: Four times a day (QID) | ORAL | Status: DC
  Administered 2017-01-21 – 2017-01-23 (×8): 600 mg via ORAL
  Filled 2017-01-21 (×8): qty 1

## 2017-01-21 MED ORDER — TERBUTALINE SULFATE 1 MG/ML IJ SOLN
0.2500 mg | Freq: Once | INTRAMUSCULAR | Status: DC | PRN
  Filled 2017-01-21: qty 1

## 2017-01-21 MED ORDER — LACTATED RINGERS IV SOLN: 500.0000 mL | Freq: Once | INTRAVENOUS | Status: DC

## 2017-01-21 MED ORDER — FENTANYL 2.5 MCG/ML BUPIVACAINE 1/10 % EPIDURAL INFUSION (WH - ANES)
INTRAMUSCULAR | Status: AC
  Filled 2017-01-21: qty 100

## 2017-01-21 MED ORDER — BENZOCAINE-MENTHOL 20-0.5 % EX AERO
1.0000 | INHALATION_SPRAY | CUTANEOUS | Status: DC | PRN
Start: 2017-01-21 — End: 2017-01-23
  Administered 2017-01-21: 1 via TOPICAL
  Filled 2017-01-21: qty 56

## 2017-01-21 MED ORDER — SENNOSIDES-DOCUSATE SODIUM 8.6-50 MG PO TABS
2.0000 | ORAL_TABLET | ORAL | Status: DC
  Administered 2017-01-21 – 2017-01-23 (×2): 2 via ORAL
  Filled 2017-01-21 (×2): qty 2

## 2017-01-21 MED ORDER — ONDANSETRON HCL 4 MG PO TABS: 4.0000 mg | ORAL_TABLET | ORAL | Status: DC | PRN

## 2017-01-21 MED ORDER — PHENYLEPHRINE 40 MCG/ML (10ML) SYRINGE FOR IV PUSH (FOR BLOOD PRESSURE SUPPORT)
80.0000 ug | PREFILLED_SYRINGE | INTRAVENOUS | Status: DC | PRN
  Filled 2017-01-21: qty 5

## 2017-01-21 MED ORDER — ONDANSETRON HCL 4 MG/2ML IJ SOLN: 4.0000 mg | INTRAMUSCULAR | Status: DC | PRN

## 2017-01-21 MED ORDER — PHENYLEPHRINE 40 MCG/ML (10ML) SYRINGE FOR IV PUSH (FOR BLOOD PRESSURE SUPPORT)
PREFILLED_SYRINGE | INTRAVENOUS | Status: AC
  Filled 2017-01-21: qty 20

## 2017-01-21 MED ORDER — LEVOTHYROXINE SODIUM 75 MCG PO TABS
75.0000 ug | ORAL_TABLET | Freq: Every day | ORAL | Status: DC
  Administered 2017-01-22 – 2017-01-23 (×2): 75 ug via ORAL
  Filled 2017-01-21 (×3): qty 1

## 2017-01-21 MED ORDER — SIMETHICONE 80 MG PO CHEW
80.0000 mg | CHEWABLE_TABLET | ORAL | Status: DC | PRN
  Administered 2017-01-21: 80 mg via ORAL
  Filled 2017-01-21: qty 1

## 2017-01-21 MED ORDER — FENTANYL 2.5 MCG/ML BUPIVACAINE 1/10 % EPIDURAL INFUSION (WH - ANES)
14.0000 mL/h | INTRAMUSCULAR | Status: DC | PRN
  Administered 2017-01-21: 14 mL/h via EPIDURAL

## 2017-01-21 MED ORDER — COCONUT OIL OIL
1.0000 "application " | TOPICAL_OIL | Status: DC | PRN
  Administered 2017-01-22: 1 via TOPICAL
  Filled 2017-01-21: qty 120

## 2017-01-21 MED ORDER — PRENATAL MULTIVITAMIN CH
1.0000 | ORAL_TABLET | Freq: Every day | ORAL | Status: DC
  Administered 2017-01-22 – 2017-01-23 (×2): 1 via ORAL
  Filled 2017-01-21 (×2): qty 1

## 2017-01-21 MED ORDER — DIPHENHYDRAMINE HCL 50 MG/ML IJ SOLN: 12.5000 mg | INTRAMUSCULAR | Status: DC | PRN

## 2017-01-21 MED ORDER — OXYTOCIN 40 UNITS IN LACTATED RINGERS INFUSION - SIMPLE MED
1.0000 m[IU]/min | INTRAVENOUS | Status: DC
  Administered 2017-01-21: 2 m[IU]/min via INTRAVENOUS

## 2017-01-21 MED ORDER — ACETAMINOPHEN 325 MG PO TABS
650.0000 mg | ORAL_TABLET | ORAL | Status: DC | PRN
  Administered 2017-01-23 (×2): 650 mg via ORAL
  Filled 2017-01-21: qty 2

## 2017-01-21 NOTE — Anesthesia Pain Management Evaluation Note (Signed)
  CRNA Pain Management Visit Note  Patient: Ann Rogers, 41 y.o., female  "Hello I am a member of the anesthesia team at [facility]. We have an anesthesia team available at all times to provide care throughout the hospital, including epidural management and anesthesia for C-section. I don't know your plan for the delivery whether it a natural birth, water birth, IV sedation, nitrous supplementation, doula or epidural, but we want to meet your pain goals."   1.Was your pain managed to your expectations on prior hospitalizations?   No prior hospitalizations  2.What is your expectation for pain management during this hospitalization?     Epidural  3.How can we help you reach that goal? Epidural when ready.  Record the patient's initial score and the patient's pain goal.   Pain: 3  Pain Goal: 5 [facility] wants you to be able to say your pain was always managed very well.  Webber Michiels 01/21/2017

## 2017-01-21 NOTE — Anesthesia Postprocedure Evaluation (Signed)
Anesthesia Post Note  Patient: Ann Rogers  Procedure(s) Performed: * No procedures listed *  Patient location during evaluation: Mother Baby Anesthesia Type: Epidural Level of consciousness: awake Pain management: satisfactory to patient Vital Signs Assessment: post-procedure vital signs reviewed and stable Respiratory status: spontaneous breathing Cardiovascular status: stable Anesthetic complications: no        Last Vitals:  Vitals:   01/21/17 1530 01/21/17 1630  BP: 102/66 116/64  Pulse: 63 74  Resp: 16 18  Temp: 37 C 37.1 C    Last Pain:  Vitals:   01/21/17 1630  TempSrc: Oral  PainSc:    Pain Goal:                 KeyCorpBURGER,Whyatt Klinger

## 2017-01-21 NOTE — Lactation Note (Signed)
This note was copied from a baby's chart. Lactation Consultation Note  Patient Name: Ann Rogers ONGEX'BToday's Date: 01/21/2017 Reason for consult: Follow-up assessment   Follow up with mom to set up DEBP. Mom and her sister were shown how to assemble, disassemble, and clean pump parts. Enc mom to BF infant at least every 3 hours, Supplement with EBM, pump for 15 minutes with DEBP on Initiate setting, and hand express. Breast milk handling and storage reviewed. Mom has Doula at bedside and plan was written on the board. Enc mom to call out for assistance with feeding as needed.    Maternal Data Formula Feeding for Exclusion: No Has patient been taught Hand Expression?: Yes Does the patient have breastfeeding experience prior to this delivery?: No  Feeding Feeding Type: Breast Fed Length of feed: 25 min  LATCH Score/Interventions Latch: Grasps breast easily, tongue down, lips flanged, rhythmical sucking.  Audible Swallowing: A few with stimulation Intervention(s): Skin to skin;Hand expression;Alternate breast massage  Type of Nipple: Everted at rest and after stimulation  Comfort (Breast/Nipple): Soft / non-tender     Hold (Positioning): Assistance needed to correctly position infant at breast and maintain latch. Intervention(s): Breastfeeding basics reviewed;Support Pillows;Position options;Skin to skin  LATCH Score: 8  Lactation Tools Discussed/Used WIC Program: No Pump Review: Setup, frequency, and cleaning;Milk Storage Initiated by:: Noralee StainSharon Cedric Denison, RN, IBCLC Date initiated:: 01/21/17   Consult Status Consult Status: Follow-up Date: 01/22/17 Follow-up type: In-patient    Silas FloodSharon S Halei Hanover 01/21/2017, 4:48 PM

## 2017-01-21 NOTE — Anesthesia Preprocedure Evaluation (Signed)
Anesthesia Evaluation  Patient identified by MRN, date of birth, ID band Patient awake    Reviewed: Allergy & Precautions, NPO status , Patient's Chart, lab work & pertinent test results  Airway Mallampati: II  TM Distance: >3 FB Neck ROM: Full    Dental  (+) Teeth Intact, Dental Advisory Given   Pulmonary neg pulmonary ROS,    Pulmonary exam normal breath sounds clear to auscultation       Cardiovascular Exercise Tolerance: Good negative cardio ROS Normal cardiovascular exam Rhythm:Regular Rate:Normal     Neuro/Psych negative neurological ROS     GI/Hepatic negative GI ROS, Neg liver ROS,   Endo/Other  Hypothyroidism   Renal/GU negative Renal ROS     Musculoskeletal negative musculoskeletal ROS (+)   Abdominal   Peds  Hematology  (+) Blood dyscrasia, anemia , Plt 191k   Anesthesia Other Findings Day of surgery medications reviewed with the patient.  Reproductive/Obstetrics (+) Pregnancy                             Anesthesia Physical Anesthesia Plan  ASA: II  Anesthesia Plan: Epidural   Post-op Pain Management:    Induction:   Airway Management Planned:   Additional Equipment:   Intra-op Plan:   Post-operative Plan:   Informed Consent: I have reviewed the patients History and Physical, chart, labs and discussed the procedure including the risks, benefits and alternatives for the proposed anesthesia with the patient or authorized representative who has indicated his/her understanding and acceptance.   Dental advisory given  Plan Discussed with:   Anesthesia Plan Comments: (Patient identified. Risks/Benefits/Options discussed with patient including but not limited to bleeding, infection, nerve damage, paralysis, failed block, incomplete pain control, headache, blood pressure changes, nausea, vomiting, reactions to medication both or allergic, itching and postpartum back  pain. Confirmed with bedside nurse the patient's most recent platelet count. Confirmed with patient that they are not currently taking any anticoagulation, have any bleeding history or any family history of bleeding disorders. Patient expressed understanding and wished to proceed. All questions were answered. )        Anesthesia Quick Evaluation

## 2017-01-21 NOTE — Lactation Note (Signed)
This note was copied from a baby's chart. Lactation Consultation Note  Patient Name: Ann Rogers: 01/21/2017 Reason for consult: Initial assessment;Infant < 6lbs;Other (Comment) (IUGR, Maternal Hypothyroidism)   Initial consult with first time mom of [facility] [place] [facility]. Infant weight 5 lb 5 oz. Infant conceived by sperm donor and IUI.   Infant STS with mom and fussy intermittently. Infant initially would not latch. Assisted mom with positioning, pillow support and head support. After about 45 minutes, infant latched to right breast in the football hold. Infant fed for 25 minutes needing some minimal stimulation to maintain suckling. Infant noted to have intermittent swallows with feeding. Enc mom to massage/compress breast with feeding. After feeding, hand expressed 1 ml colostrum that was spoon fed to infant, she tolerated it well. Spoon feeding shown to mom and her sister, who will be staying with mom.  Showed mom how to hand express, she was able to return demo and colostrum was noted bilaterally. Mom with firm semi compressible breasts with everted nipples. Mom reports + breast changes with pregnancy. Feeding log given with instructions for use.  LPT infant policy given and explained to mom due to infant size. Enc mom to keep infant hat on, decrease stimulation, feed infant at breast at least every 3 hours and to spoon feed colostrum after each BF. Discussed with mom setting up DEBP to begin pumping this afternoon for stimulation. Discussed that all EBM that is expressed should be fed to infant via spoon.  BF Resources Handout and LC Brochure given, mom informed of IP/OP Services, BF Support Groups and LC phone #. Enc mom to call out to desk for feedings as needed. Mom and aunt with no further questions/concerns. Mom anxious and voiced concerns that she will not be able to BF, enc her to call for help as needed.     Maternal Data Formula Feeding  for Exclusion: No Has patient been taught Hand Expression?: Yes Does the patient have breastfeeding experience prior to this delivery?: No  Feeding Feeding Type: Breast Fed Length of feed: 25 min  LATCH Score/Interventions Latch: Grasps breast easily, tongue down, lips flanged, rhythmical sucking.  Audible Swallowing: A few with stimulation Intervention(s): Skin to skin;Hand expression;Alternate breast massage  Type of Nipple: Everted at rest and after stimulation  Comfort (Breast/Nipple): Soft / non-tender     Hold (Positioning): Assistance needed to correctly position infant at breast and maintain latch. Intervention(s): Breastfeeding basics reviewed;Support Pillows;Position options;Skin to skin  LATCH Score: 8  Lactation Tools Discussed/Used WIC Program: No   Consult Status Consult Status: Follow-up Rogers: 01/21/17 Follow-up type: In-patient    Ann Rogers 01/21/2017, 3:18 PM

## 2017-01-21 NOTE — H&P (Signed)
Ann Rogers is a 41 y.o. G 1 P 0 at 38 weeks presented last night for an induction secondary to IUGR.  EFW in office this week less than 7%.  She has a very sure LMP because of Donor Sperm IUI. GBBS + OB History    Gravida Para Term Preterm AB Living   1             SAB TAB Ectopic Multiple Live Births                 Past Medical History:  Diagnosis Date  . AMA (advanced maternal age) primigravida 35+   . Hypothyroidism   . IBS (irritable bowel syndrome)   . Slow transit constipation 06/06/2014   She has MiraLax it sounds like, she responds to intermittent Dulcolax, Linzess 290 mcg not helpful    Past Surgical History:  Procedure Laterality Date  . ANAL RECTAL MANOMETRY N/A 07/01/2014   Procedure: ANAL RECTAL MANOMETRY;  Surgeon: Iva Booparl E Gessner, MD;  Location: [facility];  Service: Endoscopy;  Laterality: N/A;  . egg preservation    . LASIK  2013   Family History: family history includes Cancer in her maternal grandfather; Hyperlipidemia in her father and maternal aunt; Hypertension in her father and maternal aunt; Miscarriages / Stillbirths in her cousin; Stroke in her mother. Social History:  reports that she has never smoked. She has never used smokeless tobacco. She reports that she does not drink alcohol or use drugs.     Maternal Diabetes: No Genetic Screening: Normal Maternal Ultrasounds/Referrals: Normal Fetal Ultrasounds or other Referrals:  None Maternal Substance Abuse:  No Significant Maternal Medications:  None Significant Maternal Lab Results:  None Other Comments:  None  Review of Systems  All other systems reviewed and are negative.  History Dilation: 1.5 Effacement (%): 80 Station: -3 Exam by:: Dr. Vincente PoliGrewal Blood pressure (!) 98/56, pulse 71, temperature 97.6 F (36.4 C), temperature source Axillary, resp. rate 18, height 4\' 11"  (1.499 m), weight 63 kg (139 lb), last menstrual period 05/01/2016. Maternal Exam:  Abdomen: Fetal presentation:  vertex     Fetal Exam Fetal State Assessment: Category I - tracings are normal.     Physical Exam  Nursing note and vitals reviewed. Constitutional: She appears well-developed and well-nourished.  HENT:  Head: Normocephalic.  Eyes: Pupils are equal, round, and reactive to light.  Neck: Normal range of motion.  Cardiovascular: Normal rate and regular rhythm.   Respiratory: Effort normal.  GI: Soft.    Prenatal labs: ABO, Rh: --/--/A POS, A POS (02/22 1955) Antibody: NEG (02/22 1955) Rubella: Immune (08/09 0000) RPR: Non Reactive (02/22 1955)  HBsAg: Negative (08/09 0000)  HIV: Non-reactive (08/09 0000)  GBS: Positive (02/16 0000)   Assessment/Plan: IUP at 38 weeks IUGR Pitocin per protocol Follow labor curve Epidural Antibiotics for GBBS +  Ann Rogers L 01/21/2017, 7:52 AM

## 2017-01-21 NOTE — Lactation Note (Signed)
This note was copied from a baby's chart. Lactation Consultation Note  Patient Name: [name] ZOXWR'UToday's Date: 01/21/2017 Reason for consult: Follow-up assessment;Infant < 6lbs Mom called for assist with latch. Baby sleepy, demonstrated awakening techniques. After few attempts and using breast compression baby latched and demonstrated good suckling bursts off/on with stimulation, few noted swallows. Mom pleased. Encouraged to BF with feeding ques but at least every 3 hours, wake baby as needed to BF. Reviewed using DEBP with Mom. Encouraged to give baby back any amount of EBM she receives with hand expression or pumping. Mom to call for assist as needed with latch.   Maternal Data    Feeding Feeding Type: Breast Fed  LATCH Score/Interventions Latch: Repeated attempts needed to sustain latch, nipple held in mouth throughout feeding, stimulation needed to elicit sucking reflex. Intervention(s): Adjust position;Assist with latch;Breast massage;Breast compression  Audible Swallowing: A few with stimulation  Type of Nipple: Everted at rest and after stimulation  Comfort (Breast/Nipple): Soft / non-tender     Hold (Positioning): Assistance needed to correctly position infant at breast and maintain latch. Intervention(s): Breastfeeding basics reviewed;Support Pillows;Position options;Skin to skin  LATCH Score: 7  Lactation Tools Discussed/Used Tools: Pump Breast pump type: Double-Electric Breast Pump   Consult Status Consult Status: Follow-up Date: 01/22/17 Follow-up type: In-patient    [name], [name] 01/21/2017, 10:33 PM

## 2017-01-21 NOTE — Anesthesia Procedure Notes (Signed)
Epidural Patient location during procedure: OB Start time: 01/21/2017 9:41 AM End time: 01/21/2017 9:47 AM  Staffing Anesthesiologist: Cecile HearingURK, STEPHEN EDWARD Performed: anesthesiologist   Preanesthetic Checklist Completed: patient identified, pre-op evaluation, timeout performed, IV checked, risks and benefits discussed and monitors and equipment checked  Epidural Patient position: sitting Prep: DuraPrep Patient monitoring: blood pressure and continuous pulse ox Approach: midline Location: L3-L4 Injection technique: LOR air  Needle:  Needle type: Tuohy  Needle gauge: 17 G Needle length: 9 cm Needle insertion depth: 4.5 cm Catheter size: 19 Gauge Catheter at skin depth: 9.5 cm Test dose: negative and Other (1% Lidocaine)  Additional Notes Patient identified.  Risk benefits discussed including failed block, incomplete pain control, headache, nerve damage, paralysis, blood pressure changes, nausea, vomiting, reactions to medication both toxic or allergic, and postpartum back pain.  Patient expressed understanding and wished to proceed.  All questions were answered.  Sterile technique used throughout procedure and epidural site dressed with sterile barrier dressing. No paresthesia or other complications noted. The patient did not experience any signs of intravascular injection such as tinnitus or metallic taste in mouth nor signs of intrathecal spread such as rapid motor block. Please see nursing notes for vital signs. Reason for block:procedure for pain

## 2017-01-22 LAB — CBC
HEMATOCRIT: 24.8 % — AB (ref 36.0–46.0)
Hemoglobin: 9 g/dL — ABNORMAL LOW (ref 12.0–15.0)
MCH: 30.8 pg (ref 26.0–34.0)
MCHC: 36.3 g/dL — ABNORMAL HIGH (ref 30.0–36.0)
MCV: 84.9 fL (ref 78.0–100.0)
Platelets: 145 10*3/uL — ABNORMAL LOW (ref 150–400)
RBC: 2.92 MIL/uL — AB (ref 3.87–5.11)
RDW: 12.8 % (ref 11.5–15.5)
WBC: 14.2 10*3/uL — AB (ref 4.0–10.5)

## 2017-01-22 NOTE — Lactation Note (Signed)
This note was copied from a baby's chart. Lactation Consultation Note  Patient Name: Ann Margan [place] Michel BickersSWFUX'NToday's Date: 01/22/2017 Reason for consult: Follow-up assessment;Infant < 6lbs  Baby 31 hours old. Mom reports that her nipples are sore and baby is sleepy at the breast. Assisted mom to latch, but baby too sleepy. Assisted with hand expression and spoon-feeding, and baby took 5 ml of EBM and tolerated well. Baby able to latch to left breast in cross-cradle position and maintain a deep latch. Mom reports less discomfort with this latch, but mom's nipple a little pinched when baby stopped nursing. Mom given comfort gels with review and enc to use EBM on nipples for healing. Baby still cueing, so enc mom to continue hand expressing and spoon-feeding. Mom given supplementation guidelines, colostrum container and curve-tipped syringe with review.   Enc mom to put baby to breast with cues, and at least every 3 hours. Enc mom to supplement with EBM according to guidelines--discussed formula if mom seeing a decline in EBM or EBM amounts not increasing. Enc mom to post-pump if baby not nursing well for additional stimulation. Mom given paperwork for DEBP 2-week rental.   Maternal Data    Feeding Feeding Type: Breast Milk Length of feed: 15 min  LATCH Score/Interventions Latch: Grasps breast easily, tongue down, lips flanged, rhythmical sucking. Intervention(s): Skin to skin Intervention(s): Adjust position;Assist with latch;Breast compression  Audible Swallowing: A few with stimulation Intervention(s): Skin to skin;Hand expression  Type of Nipple: Everted at rest and after stimulation  Comfort (Breast/Nipple): Filling, red/small blisters or bruises, mild/mod discomfort  Problem noted: Mild/Moderate discomfort  Hold (Positioning): Assistance needed to correctly position infant at breast and maintain latch. Intervention(s): Position options  LATCH Score: 7  Lactation Tools  Discussed/Used     Consult Status Consult Status: Follow-up Date: 01/23/17 Follow-up type: In-patient    Ann Rogers 01/22/2017, 9:30 PM

## 2017-01-22 NOTE — Progress Notes (Signed)
Patient doing well.  No complaints.  BP 100/70 (BP Location: Right Arm)   Pulse 77   Temp 98.4 F (36.9 C) (Oral)   Resp 18   Ht 4\' 11"  (1.499 m)   Wt 63 kg (139 lb)   LMP 05/01/2016   SpO2 100%   Breastfeeding? Unknown   BMI 28.07 kg/m  Abdomen is soft and non tender  Results for orders placed or performed during the hospital encounter of 01/20/17 (from the past 24 hour(s))  CBC     Status: Abnormal   Collection Time: 01/22/17  5:10 AM  Result Value Ref Range   WBC 14.2 (H) 4.0 - 10.5 K/uL   RBC 2.92 (L) 3.87 - 5.11 MIL/uL   Hemoglobin 9.0 (L) 12.0 - 15.0 g/dL   HCT 11.924.8 (L) 14.736.0 - 82.946.0 %   MCV 84.9 78.0 - 100.0 fL   MCH 30.8 26.0 - 34.0 pg   MCHC 36.3 (H) 30.0 - 36.0 g/dL   RDW 56.212.8 13.011.5 - 86.515.5 %   Platelets 145 (L) 150 - 400 K/uL   PPD #1 Doing well Routine care Discharge home tomorrow

## 2017-01-23 ENCOUNTER — Ambulatory Visit: Payer: Self-pay

## 2017-01-23 MED ORDER — OXYCODONE-ACETAMINOPHEN 5-325 MG PO TABS: 1.0000 | ORAL_TABLET | ORAL | 0 refills | Status: DC | PRN

## 2017-01-23 MED ORDER — IBUPROFEN 600 MG PO TABS: 600.0000 mg | ORAL_TABLET | Freq: Four times a day (QID) | ORAL | 0 refills | Status: DC

## 2017-01-23 NOTE — Lactation Note (Addendum)
This note was copied from a baby's chart. Lactation Consultation Note  Patient Name: Ann Michel Bickersomeika Lafave WUJWJ'XToday's Date: 01/23/2017   Baby 44 hours old.  < 6 lbs.  5.8% weight loss.  5% weight loss last night. Baby has not fed in 4 hours due to sleepiness.  Attempted finger syringe feeding and breastfeeding and she is too sleepy to feed. Answered lots of questions.  Mother needs lots of reassurance. Introduced Alimentum supplementation with slow flow nipple and discussed LPI feeding plan. It took a few attempts before baby would take slow flow nipple. Recommend mother breastfeed if baby will latch and supplement after every feeding. Baby received 4 ml of pumped breastmilk and 10 ml of formula.  Mother will continue to increase volumes per guidelines.   Recommend mother post pump after feedings for 10-15 min at least 4-6 times a day. Give volume back to baby with the difference with formula. Discussed increasing volumes and small infant feeding behavior. Reassured mother and recommend she make an appt with OP lactation consultant.  [facility]  [facility] and will make an appt with her.  Reminded her of our OP services in case she has trouble making an appt.        Maternal Data    Feeding Feeding Type: Breast Fed  LATCH Score/Interventions                      Lactation Tools Discussed/Used     Consult Status      Hardie PulleyBerkelhammer, Ruth Boschen 01/23/2017, 10:22 AM

## 2017-01-23 NOTE — Lactation Note (Signed)
This note was copied from a baby's chart. Lactation Consultation Note  Patient Name: Ann Rogers WUJWJ'XToday's Date: 01/23/2017 Reason for consult: Follow-up assessment;Infant < 6lbs  Baby 55 hours old. Mom's sister giving baby a bottle when this LC entered the room. Baby able to take 17 ml of formula in about 10 minutes with minimal emesis. Mom has EBM at bedside. Reviewed LPI guidelines and stressed limiting total feeding time to 30 minutes. Plan is for mom to put baby to breast with cues--at least every 3 hours, then supplement with EBM/formula according to guidelines using bottle. Enc mom to give EBM first, then make up the difference with formula. Enc giving at least 30 ml with next few feedings and slowly increase the amounts. Enc family member to give baby the supplement while mom uses DEBP with each feeding. Enc focusing on feeding the baby and protecting mom's milk supply.    Maternal Data    Feeding Feeding Type: Formula  LATCH Score/Interventions                      Lactation Tools Discussed/Used     Consult Status Consult Status: Follow-up Date: 01/24/17 Follow-up type: In-patient    Ann HayJennifer D Kyarah Rogers 01/23/2017, 8:39 PM

## 2017-01-23 NOTE — Discharge Summary (Signed)
Obstetric Discharge Summary Reason for Admission: induction of labor Prenatal Procedures: none Intrapartum Procedures: vacuum Postpartum Procedures: none Complications-Operative and Postpartum: 2nd degree perineal laceration Hemoglobin  Date Value Ref Range Status  01/22/2017 9.0 (Rogers) 12.0 - 15.0 g/dL Final   HCT  Date Value Ref Range Status  01/22/2017 24.8 (Rogers) 36.0 - 46.0 % Final    Physical Exam:  General: alert, cooperative and appears stated age 89Lochia: appropriate Uterine Fundus: firm Incision: healing well, no significant drainage, no dehiscence DVT Evaluation: No evidence of DVT seen on physical exam.  Discharge Diagnoses: Term Pregnancy-delivered  Discharge Information: Date: 01/23/2017 Activity: pelvic rest Diet: routine Medications: Ibuprofen and Percocet Condition: improved Instructions: refer to practice specific booklet Discharge to: home   Newborn Data: Live born female  Birth Weight: 5 lb 5.2 oz (2415 g) APGAR: 9, 9  Home with mother.  Ann Rogers 01/23/2017, 6:54 AM

## 2017-01-23 NOTE — Lactation Note (Signed)
This note was copied from a baby's chart. Lactation Consultation Note  Patient Name: Ann Rogers ZOXWR'UToday's Date: 01/23/2017 Reason for consult: Infant < 6lbs   Mother called for help with feeding.  Mother hand expressed drops. Baby would not open to latch. Mother had difficulty giving baby bottle due to sleepiness. Unwrapped baby and demonstrated how to feed baby on side and upright away from body. Demonstrated how to pace feed and burp. Baby received 20 ml and then spit up some and fell asleep. Mother teary.  Encouraged her.   Maternal Data    Feeding Feeding Type: Bottle Fed - Formula  LATCH Score/Interventions                      Lactation Tools Discussed/Used     Consult Status Consult Status: Follow-up Date: 01/24/17 Follow-up type: In-patient    Ann Rogers, Ann Rogers [facility] 01/23/2017, 1:46 PM

## 2017-01-24 ENCOUNTER — Ambulatory Visit: Payer: Self-pay

## 2017-01-24 NOTE — Lactation Note (Signed)
This note was copied from a baby's chart. Lactation Consultation Note  Patient Name: [name] ZOXWR'UToday's Date: 01/24/2017 Reason for consult: Follow-up assessment;Infant < 6lbs   Follow up with mom of 68 hour old infant. Infant has been bottle feeding breast milk and formula. Mom reports she is having difficulty with latching. She has an appointment with Jimmye NormanBeth Sanders this afternoon.   Mom was pumping when I went into room. Mom says she is giving herself a hard time since infant had to receive formula. Enc mom to take one day at a time and reviewed milk coming to volume. Breasts noted to be full today and shiny. Mom pumped about 12 cc from both breasts. Outer aspect of left breast noted to be firm and knotty. Engorgement Treatment in BF Mother Handout given and explained. Mom reports she is pumping 4-6 x a day. Discussed with mom that if infant not BF at the breast the recommendation would be to pump at least 8 x a day. Mom voiced understanding. Breast Milk storage and handling chart shown to family.   Mom rented a pump for 2 weeks for pumping. Enc mom to relax and enjoy infant and to continue to attempt to BF and pump to maintain milk supply. Mom with history of Hypothyroidism, enc her to speak to MD about whether thyroid levels need to be checked in the PP period. Infant with follow up Ped appt scheduled. Mom without further questions/concerns.     Maternal Data Formula Feeding for Exclusion: No Has patient been taught Hand Expression?: Yes Does the patient have breastfeeding experience prior to this delivery?: No  Feeding    LATCH Score/Interventions                      Lactation Tools Discussed/Used     Consult Status Consult Status: Complete Follow-up type: Call as needed    Ed BlalockSharon S Makinzee Durley 01/24/2017, 10:24 AM

## 2017-01-24 NOTE — Lactation Note (Signed)
This note was copied from a baby's chart. Lactation Consultation Note Mom supplementing 10-20 ml colostrum as well as formula. Lost 0.01oz. In 24 hrs. LC monitoring weight loss d/t 5.0lbs Patient Name: Girl Michel Bickersomeika Vecchio NGEXB'MToday's Date: 01/24/2017     Maternal Data    Feeding Feeding Type: Bottle Fed - Formula  LATCH Score/Interventions                      Lactation Tools Discussed/Used     Consult Status      Samiksha Pellicano G 01/24/2017, 2:14 AM

## 2017-01-27 ENCOUNTER — Telehealth: Payer: Self-pay

## 2017-01-27 ENCOUNTER — Telehealth: Payer: Self-pay | Admitting: Internal Medicine

## 2017-01-27 NOTE — Telephone Encounter (Signed)
Called and scheduled patient for lab work, no other questions at this time.

## 2017-01-27 NOTE — Telephone Encounter (Signed)
Patient stated she had her baby and her obgyn want her to come in sooner to see Gherghe. Please advse

## 2017-01-27 NOTE — Telephone Encounter (Signed)
Called and scheduled patient for lab work. No other questions at this time.

## 2017-01-27 NOTE — Telephone Encounter (Signed)
Let's only have her back for labs for now (labs are in) and then I will see her in April, unless there are any other issues.

## 2017-01-28 ENCOUNTER — Other Ambulatory Visit (INDEPENDENT_AMBULATORY_CARE_PROVIDER_SITE_OTHER): Payer: Federal, State, Local not specified - PPO

## 2017-01-28 ENCOUNTER — Telehealth: Payer: Self-pay

## 2017-01-28 DIAGNOSIS — E039 Hypothyroidism, unspecified: Secondary | ICD-10-CM | POA: Diagnosis not present

## 2017-01-28 LAB — TSH: TSH: 1.33 u[IU]/mL (ref 0.35–4.50)

## 2017-01-28 LAB — T4, FREE: FREE T4: 1 ng/dL (ref 0.60–1.60)

## 2017-01-28 NOTE — Telephone Encounter (Signed)
-----   Message from Carlus Pavlovristina Gherghe, MD sent at 01/28/2017  4:28 PM EST ----- Raynelle FanningJulie, can you please call pt: TFTs are great!

## 2017-01-28 NOTE — Telephone Encounter (Signed)
Called patient and gave lab results. Patient had no questions or concerns.  

## 2017-03-07 DIAGNOSIS — Z1389 Encounter for screening for other disorder: Secondary | ICD-10-CM | POA: Diagnosis not present

## 2017-03-07 LAB — HM PAP SMEAR: HM Pap smear: NORMAL

## 2017-03-18 ENCOUNTER — Encounter: Payer: Self-pay | Admitting: Internal Medicine

## 2017-03-18 ENCOUNTER — Ambulatory Visit (INDEPENDENT_AMBULATORY_CARE_PROVIDER_SITE_OTHER): Payer: Federal, State, Local not specified - PPO | Admitting: Internal Medicine

## 2017-03-18 VITALS — BP 118/80 | HR 76 | Temp 98.0°F | Resp 16 | Ht 59.0 in | Wt 123.2 lb

## 2017-03-18 DIAGNOSIS — E039 Hypothyroidism, unspecified: Secondary | ICD-10-CM | POA: Diagnosis not present

## 2017-03-18 LAB — TSH: TSH: 0.38 u[IU]/mL (ref 0.35–4.50)

## 2017-03-18 LAB — T4, FREE: Free T4: 1.08 ng/dL (ref 0.60–1.60)

## 2017-03-18 NOTE — Patient Instructions (Signed)
Please stop at the lab.  Please continue Levothyroxine 75 mcg for now.  Take the thyroid hormone every day, with water, at least 30 minutes before breakfast, separated by at least 4 hours from: - acid reflux medications - calcium - iron - multivitamins  Please come back for a follow-up appointment in 6 months.

## 2017-03-18 NOTE — Progress Notes (Signed)
Patient ID: Ann Rogers, female   DOB: Sep 19, 1976, 41 y.o.   MRN: [id number]   HPI  DYNASTY HOLQUIN is a 41 y.o.-year-old female, returning for f/u for hypothyroidism, dx 07/2013. Last visit 5 mo ago.  At last visit, we use a higher dose of levothyroxine, 75 g daily, during the pregnancy. She gave birth to a healthy girl (but small: 5 lbs) 2 months ago. She continues on the same dose of levothyroxine, 2 months ago as labs obtained 3 weeks after giving birth returned normal.   Reviewed hx: Pt. has been dx with hypothyroidism in 07/2013 (TSH high, but pt not told how high, and records not available) >> started on Levothyroxine 25 mcg.  She takes the LT4: Fasting with water  no breakfast On calcium later in the day No iron, no PPIs  Latest labs: Lab Results  Component Value Date   TSH 1.33 01/28/2017   TSH 1.56 10/19/2016   TSH 0.83 09/06/2016   TSH 1.51 08/04/2016   TSH 1.55 07/14/2016   FREET4 1.00 01/28/2017   FREET4 0.86 10/19/2016   FREET4 0.92 09/06/2016   FREET4 1.06 08/04/2016   FREET4 1.15 07/14/2016   . Thyroid Peroxidase Antibody 10/19/2013 10.6  <35.0 IU/mL Final  Thyroid peroxidase antibodies not high, so likely not Hashimoto thyroiditis.   Pt denies feeling nodules in neck, hoarseness, dysphagia/odynophagia, SOB with lying down.  She c/o: - + weight gainWith pregnancy, now weight loss - + fatigue  - + constipation - chronic - no dry skin - no hair falling - no depression  ROS: Constitutional: see above Eyes: no blurry vision, no xerophthalmia ENT: no sore throat, no nodules palpated in throat, no dysphagia/odynophagia, no hoarseness Cardiovascular: no CP/SOB/palpitations/leg swelling Respiratory: no cough/SOB Gastrointestinal: no N/V/D/+ C Musculoskeletal: no muscle/joint aches Skin: no rashes Neurological: no tremors/numbness/tingling/dizziness  I reviewed pt's medications, allergies, PMH, social hx, family hx, and changes were documented  in the history of present illness. Otherwise, unchanged from my initial visit note.  PE: BP 118/80 (BP Location: Left Arm, Patient Position: Sitting, Cuff Size: Normal)   Pulse 76   Temp 98 F (36.7 C) (Oral)   Resp 16   Ht  (1.499 m)   Wt 123 lb 3.2 oz (55.9 kg)   SpO2 98%   BMI 24.88 kg/m  Body mass index is 24.88 kg/m. Wt Readings from Last 3 Encounters:  03/18/17 123 lb 3.2 oz (55.9 kg)  01/20/17 139 lb (63 kg)  10/19/16 129 lb (58.5 kg)   Constitutional: normal weight, in NAD Eyes: PERRLA, EOMI, no exophthalmos ENT: moist mucous membranes, no thyromegaly, no cervical lymphadenopathy Cardiovascular: RRR, No MRG Respiratory: CTA B Gastrointestinal: abdomen soft, NT, ND, BS+ Musculoskeletal: no deformities, strength intact in all 4 Skin: moist, warm, no rashes Neurological: no tremor with outstretched hands, DTR normal in all 4  ASSESSMENT: 1. Hypothyroidism - in pregnancy  PLAN:  1. Patient with mild hypothyroidism, on levothyroxine (LT4) therapy 75 mcg, Dose increased during the pregnancy from 25 g before getting pregnant, with normal labs during the pregnancy on the higher dose. She did have another lab check 3 weeks after she gave birth, and the TFTs were and perfect. We will recheck again today, 2 months from giving birth, as we may need to decrease the dose  - She appears euthyroid, but does complain of fatigue, and lack of sleep, since her baby wakes up every 2 hours to eat  - we reviewed her latest thyroid tests -  all normal - continue LT4 75 mcg for now - RTC in 6 months   Needs refills.   Component     Latest Ref Rng & Units 03/18/2017  TSH     0.35 - 4.50 uIU/mL 0.38  T4,Free(Direct)     0.60 - 1.60 ng/dL 0.86  TSH slightly low in the normal range >> decrease LT4 to 50 mcg daily and repeat TFTs in 2 mo.  Carlus Pavlov, MD PhD [facility] Endocrinology

## 2017-03-18 NOTE — Progress Notes (Signed)
Pre visit review using our clinic review tool, if applicable. No additional management support is needed unless otherwise documented below in the visit note. 

## 2017-03-21 ENCOUNTER — Telehealth: Payer: Self-pay

## 2017-03-21 MED ORDER — LEVOTHYROXINE SODIUM 50 MCG PO TABS: 50.0000 ug | ORAL_TABLET | Freq: Every day | ORAL | 5 refills | Status: DC

## 2017-03-21 NOTE — Telephone Encounter (Signed)
-----   Message from Carlus Pavlov, MD sent at 03/21/2017  7:56 AM EDT ----- [name], can you please call pt: TSH slightly low in the normal range >> decrease LT4 to 50 mcg. She can take 2 of the 25 mcg tabs daily. If she is out >> let's send a Rx for the 50 (new dose is in, but did not send it to her pharm yet). She needs labs in 2 mo. Labs are in.

## 2017-03-21 NOTE — Telephone Encounter (Signed)
Called patient and gave lab results. Patient had no questions or concerns. Submitted RX to pharmacy as patient was out.

## 2017-04-20 DIAGNOSIS — M654 Radial styloid tenosynovitis [de Quervain]: Secondary | ICD-10-CM | POA: Diagnosis not present

## 2017-05-04 DIAGNOSIS — K080 Exfoliation of teeth due to systemic causes: Secondary | ICD-10-CM | POA: Diagnosis not present

## 2017-05-24 ENCOUNTER — Other Ambulatory Visit: Payer: Federal, State, Local not specified - PPO

## 2017-05-30 ENCOUNTER — Other Ambulatory Visit (INDEPENDENT_AMBULATORY_CARE_PROVIDER_SITE_OTHER): Payer: Federal, State, Local not specified - PPO

## 2017-05-30 DIAGNOSIS — E039 Hypothyroidism, unspecified: Secondary | ICD-10-CM

## 2017-05-30 LAB — T4, FREE: Free T4: 1.02 ng/dL (ref 0.60–1.60)

## 2017-05-30 LAB — TSH: TSH: 1.68 u[IU]/mL (ref 0.35–4.50)

## 2017-05-31 IMAGING — US US ABDOMEN LIMITED
1 series · 14 of 25 positions shown · non-contrast
Comparison: 06/11/2014

CLINICAL DATA: Elevated liver function tests.

EXAM:
US ABDOMEN LIMITED - RIGHT UPPER QUADRANT

[Series 1: us abdomen limited · 0.20mm/px · 14 of 38 slices shown]
[im 1/38]
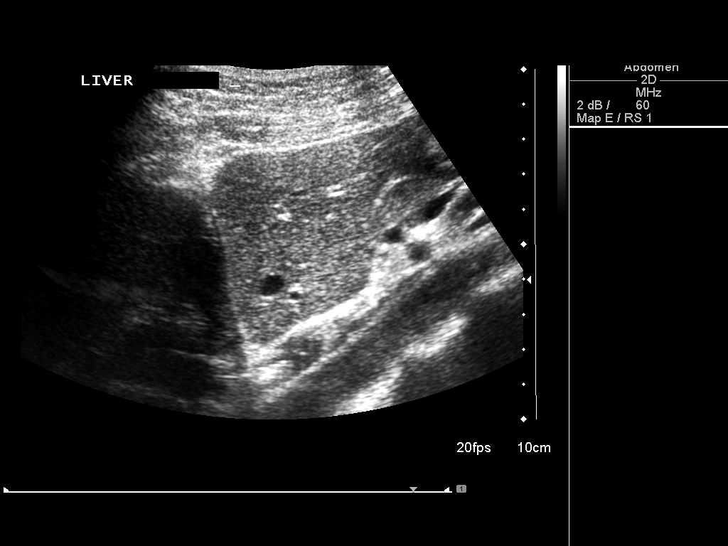
[im 4/38]
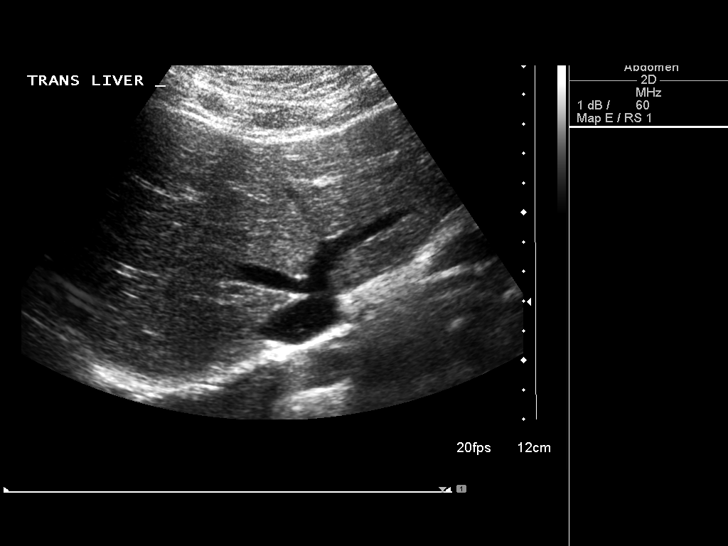
[im 7/38]
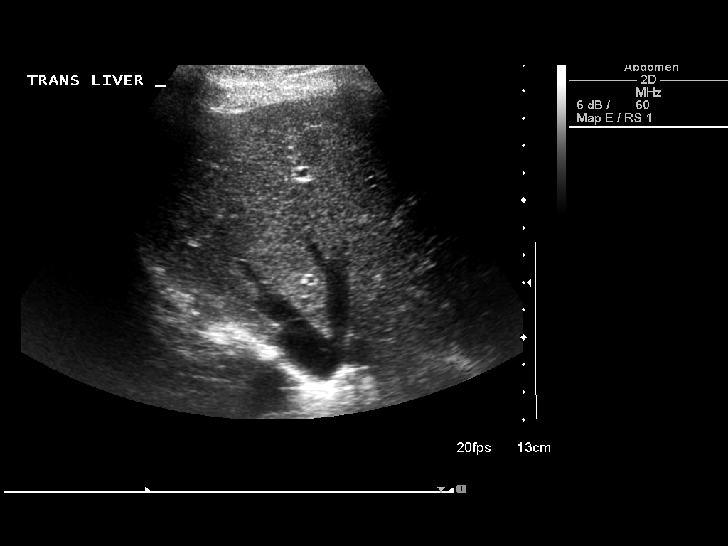
[im 10/38]
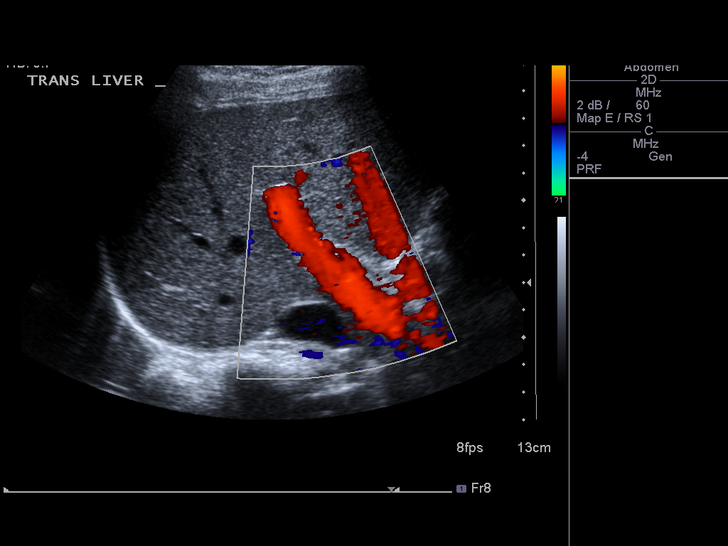
[im 13/38]
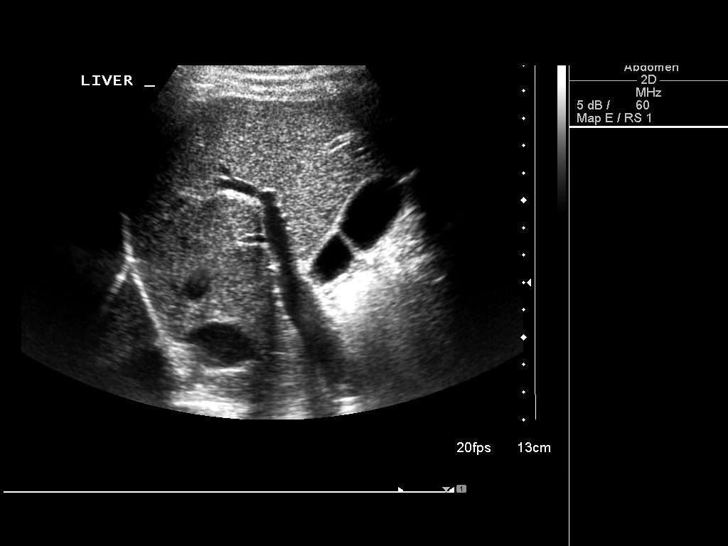
[im 14/38]
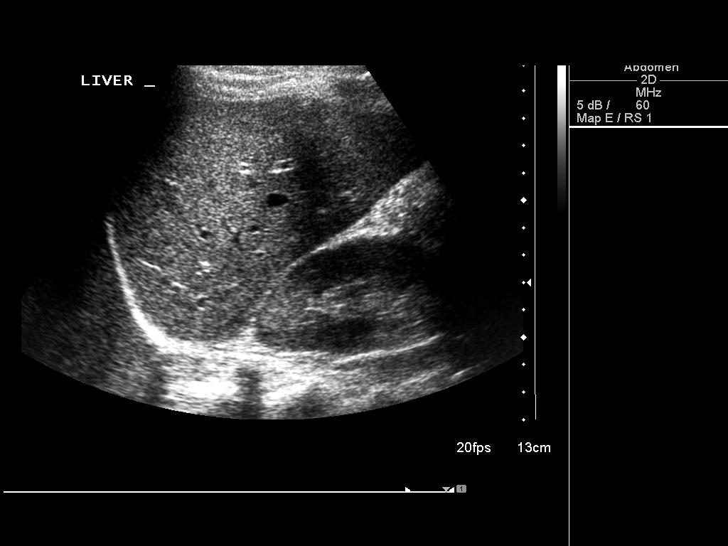
[im 17/38]
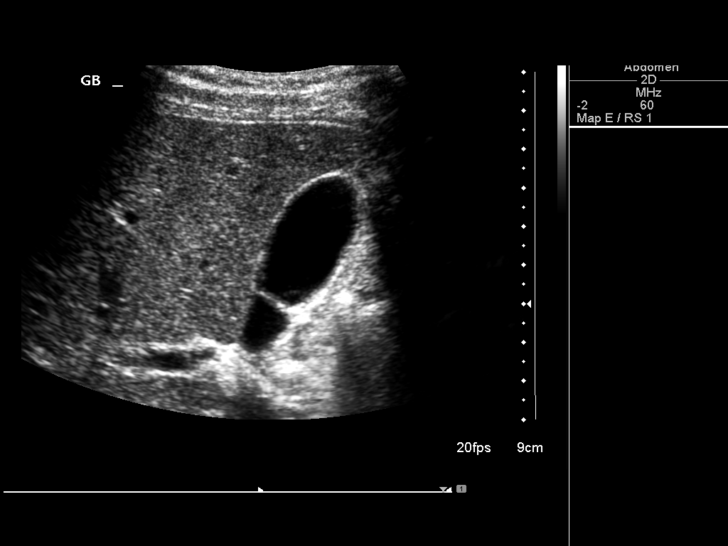
[im 21/38]
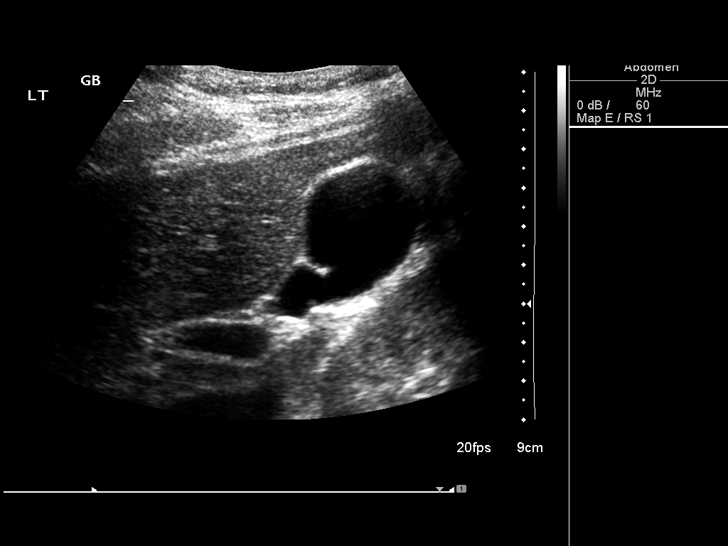
[im 24/38]
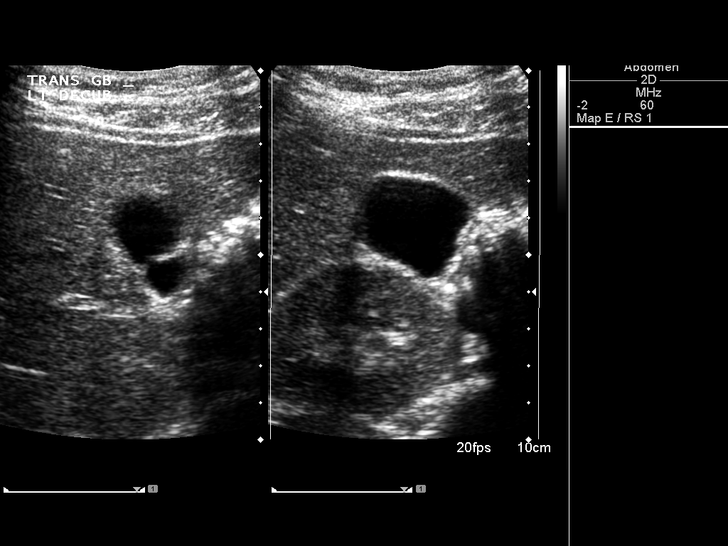
[im 25/38]
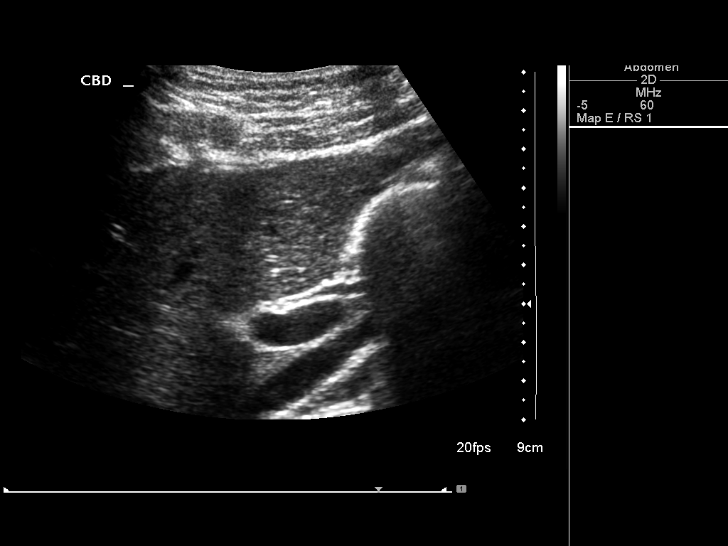
[im 28/38]
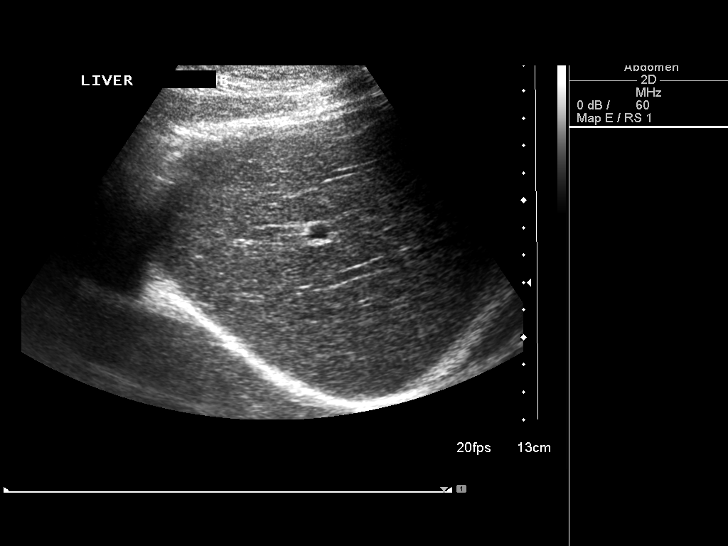
[im 31/38]
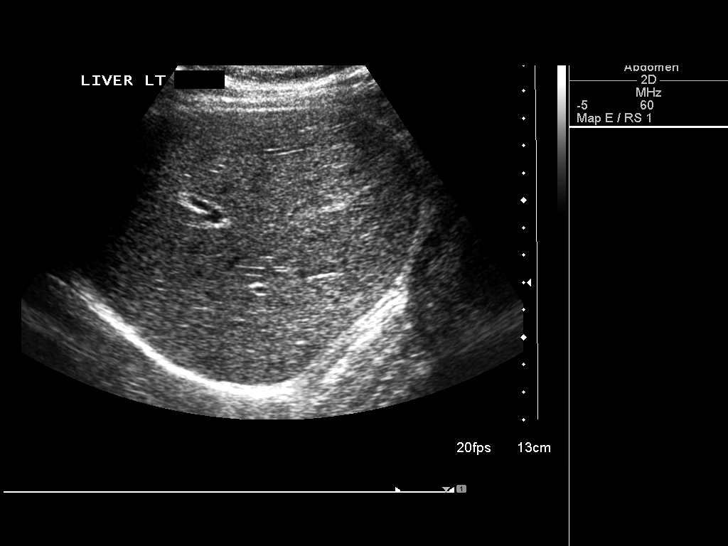
[im 34/38]
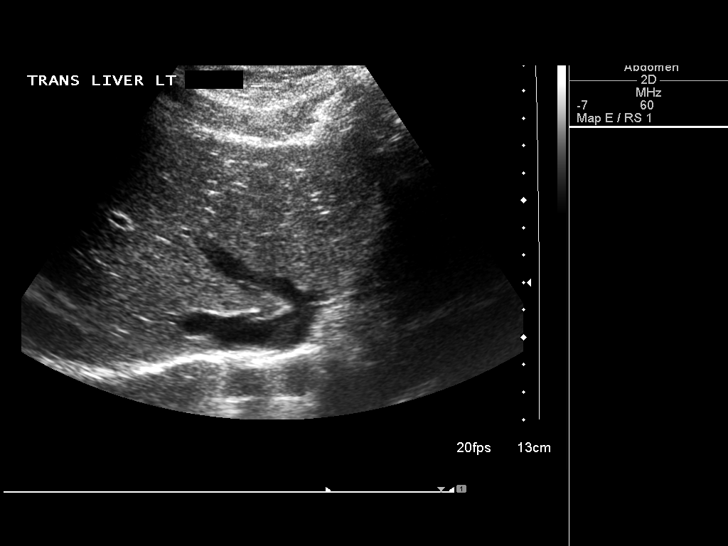
[im 38/38]
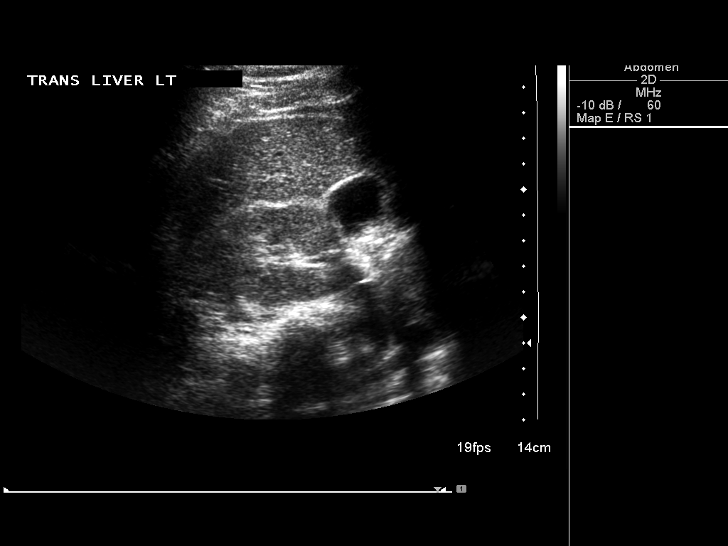

[14 of 25 positions shown; findings below may reference images not displayed]

FINDINGS: Gallbladder:

No gallstones or wall thickening visualized. No sonographic Murphy
sign noted by sonographer.

Common bile duct:

Diameter: 3 mm

Liver:

No focal lesion identified. Within normal limits in parenchymal
echogenicity.
IMPRESSION: Normal sonographic appearance of the hepatobiliary system.

## 2017-06-06 ENCOUNTER — Telehealth: Payer: Self-pay

## 2017-06-06 NOTE — Telephone Encounter (Signed)
LVM, gave lab results. Gave call back number if any questions or concerns.  

## 2017-06-06 NOTE — Telephone Encounter (Signed)
-----   Message from Ann Pavlovristina Gherghe, MD sent at 06/06/2017  1:11 PM EDT ----- Raynelle FanningJulie, can you please call pt: TFTs are great!

## 2017-07-05 ENCOUNTER — Ambulatory Visit (INDEPENDENT_AMBULATORY_CARE_PROVIDER_SITE_OTHER): Payer: Federal, State, Local not specified - PPO | Admitting: Family Medicine

## 2017-07-05 ENCOUNTER — Encounter: Payer: Self-pay | Admitting: Family Medicine

## 2017-07-05 ENCOUNTER — Encounter: Payer: Self-pay | Admitting: *Deleted

## 2017-07-05 VITALS — BP 118/78 | HR 72 | Temp 97.9°F | Resp 14 | Ht 59.0 in | Wt 124.0 lb

## 2017-07-05 DIAGNOSIS — Z0000 Encounter for general adult medical examination without abnormal findings: Secondary | ICD-10-CM | POA: Diagnosis not present

## 2017-07-05 DIAGNOSIS — E559 Vitamin D deficiency, unspecified: Secondary | ICD-10-CM | POA: Diagnosis not present

## 2017-07-05 DIAGNOSIS — E039 Hypothyroidism, unspecified: Secondary | ICD-10-CM | POA: Diagnosis not present

## 2017-07-05 LAB — COMPREHENSIVE METABOLIC PANEL
ALT: 23 U/L (ref 6–29)
AST: 23 U/L (ref 10–30)
Albumin: 3.9 g/dL (ref 3.6–5.1)
Alkaline Phosphatase: 81 U/L (ref 33–115)
BUN: 13 mg/dL (ref 7–25)
CHLORIDE: 105 mmol/L (ref 98–110)
CO2: 24 mmol/L (ref 20–32)
CREATININE: 0.82 mg/dL (ref 0.50–1.10)
Calcium: 8.9 mg/dL (ref 8.6–10.2)
GLUCOSE: 75 mg/dL (ref 70–99)
Potassium: 4.2 mmol/L (ref 3.5–5.3)
SODIUM: 139 mmol/L (ref 135–146)
TOTAL PROTEIN: 6.7 g/dL (ref 6.1–8.1)
Total Bilirubin: 0.4 mg/dL (ref 0.2–1.2)

## 2017-07-05 LAB — CBC WITH DIFFERENTIAL/PLATELET
Basophils Absolute: 60 cells/uL (ref 0–200)
Basophils Relative: 1 %
EOS PCT: 2 %
Eosinophils Absolute: 120 cells/uL (ref 15–500)
HCT: 37.9 % (ref 35.0–45.0)
Hemoglobin: 12.3 g/dL (ref 12.0–15.0)
Lymphocytes Relative: 30 %
Lymphs Abs: 1800 cells/uL (ref 850–3900)
MCH: 28.7 pg (ref 27.0–33.0)
MCHC: 32.5 g/dL (ref 32.0–36.0)
MCV: 88.6 fL (ref 80.0–100.0)
MONOS PCT: 11 %
MPV: 9.7 fL (ref 7.5–12.5)
Monocytes Absolute: 660 cells/uL (ref 200–950)
NEUTROS ABS: 3360 {cells}/uL (ref 1500–7800)
Neutrophils Relative %: 56 %
PLATELETS: 231 10*3/uL (ref 140–400)
RBC: 4.28 MIL/uL (ref 3.80–5.10)
RDW: 13.6 % (ref 11.0–15.0)
WBC: 6 10*3/uL (ref 3.8–10.8)

## 2017-07-05 LAB — LIPID PANEL
CHOLESTEROL: 154 mg/dL (ref <200)
HDL: 71 mg/dL (ref >50)
LDL CALC: 75 mg/dL (ref <100)
TRIGLYCERIDES: 40 mg/dL (ref <150)
Total CHOL/HDL Ratio: 2.2 Ratio (ref <5.0)
VLDL: 8 mg/dL (ref <30)

## 2017-07-05 MED ORDER — PRENATAL VITAMIN 27-0.8 MG PO TABS: 1.0000 | ORAL_TABLET | Freq: Every day | ORAL | 0 refills | Status: DC

## 2017-07-05 NOTE — Patient Instructions (Signed)
F/U 1 year for physical  

## 2017-07-05 NOTE — Assessment & Plan Note (Signed)
Reviewed her last endocrinology note, dose  is stable.

## 2017-07-05 NOTE — Progress Notes (Signed)
   Subjective:    Patient ID: Ann Rogers, female    DOB: 01/06/1976, 41 y.o.   MRN: [id number]  Patient presents for CPE (is fasting)  Pt here for CPE   She recently had a child Months ago, followed by [facility] Dr. Vincente PoliGrewal  Followed by Dr. Elvera LennoxGherghe for HYpothyroidism   Immunizations- UTD  Family history reviewed   Overall she is doing well. She is no specific concerns. She is taking her supplements as prescribed     Review Of Systems:  GEN- denies fatigue, fever, weight loss,weakness, recent illness HEENT- denies eye drainage, change in vision, nasal discharge, CVS- denies chest pain, palpitations RESP- denies SOB, cough, wheeze ABD- denies N/V, change in stools, abd pain GU- denies dysuria, hematuria, dribbling, incontinence MSK- denies joint pain, muscle aches, injury Neuro- denies headache, dizziness, syncope, seizure activity       Objective:    BP 118/78   Pulse 72   Temp 97.9 F (36.6 C) (Oral)   Resp 14   Ht 4\' 11"  (1.499 m)   Wt 124 lb (56.2 kg)   SpO2 98%   BMI 25.04 kg/m  GEN- NAD, alert and oriented x3 HEENT- PERRL, EOMI, non injected sclera, pink conjunctiva, MMM, oropharynx clear Neck- Supple, no thyromegaly CVS- RRR, no murmur RESP-CTAB ABD-NABS,soft,NT,ND EXT- No edema Pulses- Radial, DP- 2+        Assessment & Plan:      Problem List Items Addressed This Visit    Hypothyroidism - Primary    Reviewed her last endocrinology note, dose  is stable.       Other Visit Diagnoses    Routine general medical examination at a health care facility       CPE done, immunizations UTD. No sign of post partum depression. Fasting labs today   Relevant Orders   CBC with Differential/Platelet   Comprehensive metabolic panel   Lipid panel   Vitamin D deficiency       Relevant Orders   Vitamin D, 25-hydroxy      Note: This dictation was prepared with Dragon dictation along with smaller phrase technology. Any transcriptional errors  that result from this process are unintentional.

## 2017-07-06 ENCOUNTER — Encounter: Payer: Self-pay | Admitting: *Deleted

## 2017-07-06 LAB — VITAMIN D 25 HYDROXY (VIT D DEFICIENCY, FRACTURES): Vit D, 25-Hydroxy: 40 ng/mL (ref 30–100)

## 2017-07-25 ENCOUNTER — Encounter: Payer: Self-pay | Admitting: Family Medicine

## 2017-08-17 DIAGNOSIS — Z23 Encounter for immunization: Secondary | ICD-10-CM | POA: Diagnosis not present

## 2017-09-16 ENCOUNTER — Ambulatory Visit (INDEPENDENT_AMBULATORY_CARE_PROVIDER_SITE_OTHER): Payer: Federal, State, Local not specified - PPO | Admitting: Internal Medicine

## 2017-09-16 ENCOUNTER — Encounter: Payer: Self-pay | Admitting: Internal Medicine

## 2017-09-16 VITALS — BP 122/82 | HR 74 | Wt 126.0 lb

## 2017-09-16 DIAGNOSIS — E039 Hypothyroidism, unspecified: Secondary | ICD-10-CM

## 2017-09-16 NOTE — Progress Notes (Signed)
Patient ID: Ann Rogers, female   DOB: 12/20/1975, 41 y.o.   MRN: [id number]   HPI  Ann Rogers is a 41 y.o.-year-old female, returning for f/u for hypothyroidism, dx 07/2013. Last visit 6 mo ago.   She gave birth 8 mo ago: healthy girl. During the pregnancy, we had to increase the dose of her levothyroxine, but we were able to decrease it afterwards.  She is feeling well and has no complaints today.  Reviewed and addended hx: Pt. has been dx with hypothyroidism in 07/2013 (TSH high, but pt not told how high, and records not available) >> started on Levothyroxine 25 mcg.  During pregnancy, we increased the dose to 75 mcg daily and after the pregnancy, we decreased to 50 mcg daily. Subsequent labs were normal 05/2017.  Pt takes levothyroxine daily: - in am - fasting - no b'fast - no Fe, PPIs - no calcium - + MVI at night - not on Biotin  Latest labs: Lab Results  Component Value Date   TSH 1.68 05/30/2017   TSH 0.38 03/18/2017   TSH 1.33 01/28/2017   TSH 1.56 10/19/2016   TSH 0.83 09/06/2016   FREET4 1.02 05/30/2017   FREET4 1.08 03/18/2017   FREET4 1.00 01/28/2017   FREET4 0.86 10/19/2016   FREET4 0.92 09/06/2016   . Thyroid Peroxidase Antibody 10/19/2013 10.6  <35.0 IU/mL Final  Thyroid peroxidase antibodies not high >> not Hashimoto's thyroiditis  Pt denies: - feeling nodules in neck - hoarseness - dysphagia - choking - SOB with lying down  ROS: Constitutional: no weight gain/no weight loss, no fatigue, no subjective hyperthermia, no subjective hypothermia Eyes: no blurry vision, no xerophthalmia ENT: no sore throat, + see HPI Cardiovascular: no CP/no SOB/no palpitations/no leg swelling Respiratory: no cough/no SOB/no wheezing Gastrointestinal: no N/no V/no D/no C/no acid reflux Musculoskeletal: no muscle aches/no joint aches Skin: no rashes, no hair loss Neurological: no tremors/no numbness/no tingling/no dizziness  I reviewed pt's  medications, allergies, PMH, social hx, family hx, and changes were documented in the history of present illness. Otherwise, unchanged from my initial visit note.  PE: BP 122/82 (BP Location: Left Arm, Patient Position: Sitting)   Pulse 74   Wt 126 lb (57.2 kg)   SpO2 99%   Breastfeeding? Yes   BMI 25.45 kg/m  Body mass index is 25.45 kg/m. Wt Readings from Last 3 Encounters:  09/16/17 126 lb (57.2 kg)  07/05/17 124 lb (56.2 kg)  03/18/17 123 lb 3.2 oz (55.9 kg)   Constitutional: normal weight, in NAD Eyes: PERRLA, EOMI, no exophthalmos ENT: moist mucous membranes, no thyromegaly, no cervical lymphadenopathy Cardiovascular: RRR, No MRG Respiratory: CTA B Gastrointestinal: abdomen soft, NT, ND, BS+ Musculoskeletal: no deformities, strength intact in all 4 Skin: moist, warm, no rashes Neurological: no tremor with outstretched hands, DTR normal in all 4  ASSESSMENT: 1. Hypothyroidism  PLAN:  1. Patient with mild hypothyroidism, on levothyroxine 50 g daily. Dose was decreased from 75 g daily after her pregnancy. Subsequent TFTs were reviewed today with the patient that they were normal. She now continues on 50 mcgg daily and has no complaints on this dose, but she is very tired as her daughter is still small and feeds throughout the night, q2h (breastfeeding) - latest thyroid labs reviewed with pt >> normal  - we discussed about taking the thyroid hormone every day, with water, >30 minutes before breakfast, separated by >4 hours from acid reflux medications, calcium, iron, multivitamins. Pt. is taking it  correctly, but misses doses >> advised to put the bottle of med on the nightstand, on her cell phone - will check thyroid tests in 5 weeks after starting to tale LT4 consistently: TSH and fT4 - UTD with flu shot - OTW, RTC in 1 year    Carlus Pavlov, MD PhD [facility] Endocrinology

## 2017-09-16 NOTE — Patient Instructions (Signed)
Please continue Levothyroxine 50 mcg daily.  Take the thyroid hormone every day, with water, at least 30 minutes before breakfast, separated by at least 4 hours from: - acid reflux medications - calcium - iron - multivitamins  TAKE IT EVERY DAY.  Come back for labs in ~5 weeks.  Please come back for a follow-up appointment in 1 year.

## 2017-10-24 ENCOUNTER — Telehealth: Payer: Self-pay

## 2017-10-24 ENCOUNTER — Other Ambulatory Visit (INDEPENDENT_AMBULATORY_CARE_PROVIDER_SITE_OTHER): Payer: Federal, State, Local not specified - PPO

## 2017-10-24 DIAGNOSIS — E039 Hypothyroidism, unspecified: Secondary | ICD-10-CM | POA: Diagnosis not present

## 2017-10-24 LAB — T4, FREE: Free T4: 0.94 ng/dL (ref 0.60–1.60)

## 2017-10-24 LAB — TSH: TSH: 2.06 u[IU]/mL (ref 0.35–4.50)

## 2017-10-24 NOTE — Telephone Encounter (Signed)
Called patient and gave lab results. Patient had no questions or concerns.  

## 2017-10-24 NOTE — Telephone Encounter (Signed)
-----   Message from Carlus Pavlovristina Gherghe, MD sent at 10/24/2017 12:09 PM EST ----- Ann FanningJulie, can you please call pt: Normal TFTs. No change in dose.

## 2017-11-17 DIAGNOSIS — K080 Exfoliation of teeth due to systemic causes: Secondary | ICD-10-CM | POA: Diagnosis not present

## 2018-01-04 DIAGNOSIS — J988 Other specified respiratory disorders: Secondary | ICD-10-CM | POA: Diagnosis not present

## 2018-01-04 DIAGNOSIS — R0981 Nasal congestion: Secondary | ICD-10-CM | POA: Diagnosis not present

## 2018-03-28 ENCOUNTER — Other Ambulatory Visit: Payer: Self-pay | Admitting: Internal Medicine

## 2018-04-07 ENCOUNTER — Ambulatory Visit
Admission: RE | Admit: 2018-04-07 | Discharge: 2018-04-07 | Disposition: A | Discharge status: Home / Self Care | Payer: Federal, State, Local not specified - PPO | Source: Ambulatory Visit | Attending: Pediatrics | Admitting: Pediatrics

## 2018-04-12 ENCOUNTER — Other Ambulatory Visit: Payer: Self-pay | Admitting: Internal Medicine

## 2018-04-12 ENCOUNTER — Telehealth: Payer: Self-pay | Admitting: Internal Medicine

## 2018-04-12 DIAGNOSIS — E039 Hypothyroidism, unspecified: Secondary | ICD-10-CM

## 2018-04-12 NOTE — Telephone Encounter (Signed)
Patient stated that her breast milk has took a dive,

## 2018-04-13 ENCOUNTER — Other Ambulatory Visit (INDEPENDENT_AMBULATORY_CARE_PROVIDER_SITE_OTHER): Payer: Federal, State, Local not specified - PPO

## 2018-04-13 DIAGNOSIS — E039 Hypothyroidism, unspecified: Secondary | ICD-10-CM | POA: Diagnosis not present

## 2018-04-13 LAB — TSH: TSH: 1.71 u[IU]/mL (ref 0.35–4.50)

## 2018-04-13 LAB — T4, FREE: Free T4: 0.99 ng/dL (ref 0.60–1.60)

## 2018-04-19 ENCOUNTER — Telehealth: Payer: Self-pay | Admitting: Internal Medicine

## 2018-04-19 NOTE — Telephone Encounter (Signed)
Please advise 

## 2018-04-19 NOTE — Telephone Encounter (Signed)
Patient want to know if she can take Fenugreek which is an herb that increases breast milk. Please call Patient at ph# [phone] to advise

## 2018-04-19 NOTE — Telephone Encounter (Signed)
I cannot advise her to take it, since this is not FDA regulated.  However, if she decides to start it, I do not see any interference between this and her thyroid medication.

## 2018-04-20 NOTE — Telephone Encounter (Signed)
Patient was given the advice from the note below and she stated an understanding

## 2018-05-12 ENCOUNTER — Ambulatory Visit
Admission: EM | Admit: 2018-05-12 | Discharge: 2018-05-12 | Disposition: A | Discharge status: Home / Self Care | Payer: Federal, State, Local not specified - PPO | Attending: Internal Medicine | Admitting: Internal Medicine

## 2018-05-12 ENCOUNTER — Other Ambulatory Visit: Payer: Self-pay

## 2018-05-12 ENCOUNTER — Encounter: Payer: Self-pay | Admitting: Gynecology

## 2018-05-12 DIAGNOSIS — L0100 Impetigo, unspecified: Secondary | ICD-10-CM | POA: Diagnosis not present

## 2018-05-12 MED ORDER — MUPIROCIN 2 % EX OINT: 1.0000 "application " | TOPICAL_OINTMENT | Freq: Two times a day (BID) | CUTANEOUS | 0 refills | Status: AC

## 2018-05-12 NOTE — ED Triage Notes (Signed)
Per patient c/o infection at umbilical region. Per patient notice oozing / drainage from site.

## 2018-05-12 NOTE — Discharge Instructions (Signed)
Rinse belly button area with water 1-2 times daily, apply thin smear of antibiotic ointment.  Recheck if any increasing redness/swelling/pain/drainage or new fever>100.5, or if not starting to improve in a few days.

## 2018-05-12 NOTE — ED Provider Notes (Signed)
[facility]    CSN: [id number] Arrival date & time: 05/12/18  1317     History   Chief Complaint Chief Complaint  Patient presents with  . Umbilical Infection    HPI Ann Rogers is a 42 y.o. female.  She presents today with some discharge from her bellybutton.  It was very itchy last evening, she scratched it, and this morning has had some moisture at the site.  Not particularly red or sore.  No fever, no malaise.     HPI  Past Medical History:  Diagnosis Date  . AMA (advanced maternal age) primigravida 35+   . Hypothyroidism   . IBS (irritable bowel syndrome)   . Slow transit constipation 06/06/2014   She has MiraLax it sounds like, she responds to intermittent Dulcolax, Linzess 290 mcg not helpful     Patient Active Problem List   Diagnosis Date Noted  . Abnormal transaminases - resolved 01/22/2016  . Slow transit constipation 06/06/2014  . 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 Hypothyroidism 10/19/2013    Past Surgical History:  Procedure Laterality Date  . ANAL RECTAL MANOMETRY N/A 07/01/2014   Procedure: ANAL RECTAL MANOMETRY;  Surgeon: Iva Boop, MD;  Location: [facility];  Service: Endoscopy;  Laterality: N/A;  . egg preservation    . LASIK  2013    OB History    Gravida  1   Para  1   Term  1   Preterm      AB      Living  1     SAB      TAB      Ectopic      Multiple  0   Live Births  1            Home Medications    Prior to Admission medications   Medication Sig Start Date End Date Taking? Authorizing Provider  levothyroxine (SYNTHROID, LEVOTHROID) 50 MCG tablet TAKE 1 TABLET (50 MCG TOTAL) BY MOUTH DAILY. 03/28/18  Yes Carlus Pavlov, MD  Omega-3 Fatty Acids (OMEGA 3 PO) Take 3 capsules by mouth every evening.    Yes [provider]  Prenatal Vit-Fe Fumarate-FA (PRENATAL VITAMIN) 27-0.8 MG TABS Take 1 tablet by mouth daily. 07/05/17  Yes One[place], Velna Hatchet, MD  Cholecalciferol (VITAMIN D PO) Take  1 tablet by mouth daily.    [provider]  mupirocin ointment (BACTROBAN) 2 % Apply 1 application topically 2 (two) times daily for 7 days. 05/12/18 05/19/18  Isa Rankin, MD    Family History Family History  Problem Relation Age of Onset  . Stroke Mother   . Hypertension Father   . Hyperlipidemia Father   . Cancer Maternal Grandfather   . Hypertension Maternal Aunt   . Hyperlipidemia Maternal Aunt   . Miscarriages / [place] Cousin   . Colon cancer Neg Hx   . Colon polyps Neg Hx   . Esophageal cancer Neg Hx   . Kidney disease Neg Hx     Social History Social History   Tobacco Use  . Smoking status: Never Smoker  . Smokeless tobacco: Never Used  Substance Use Topics  . Alcohol use: No  . Drug use: No     Allergies   Patient has no known allergies.   Review of Systems Review of Systems  All other systems reviewed and are negative.    Physical Exam Triage Vital Signs ED Triage Vitals  Enc Vitals Group  BP 05/12/18 1334 113/78     Pulse Rate 05/12/18 1334 71     Resp 05/12/18 1334 16     Temp 05/12/18 1334 98.5 F (36.9 C)     Temp Source 05/12/18 1334 Oral     SpO2 05/12/18 1334 100 %     Weight 05/12/18 1332 125 lb (56.7 kg)     Height 05/12/18 1332 4\' 11"  (1.499 m)     Pain Score 05/12/18 1332 1     Pain Loc --    Updated Vital Signs BP 113/78 (BP Location: Left Arm)   Pulse 71   Temp 98.5 F (36.9 C) (Oral)   Resp 16   Ht 4\' 11"  (1.499 m)   Wt 125 lb (56.7 kg)   LMP 04/11/2018   SpO2 100%   Breastfeeding? Yes   BMI 25.25 kg/m  Physical Exam  Constitutional: She is oriented to person, place, and time. No distress.  HENT:  Head: Atraumatic.  Eyes:  Conjugate gaze observed, no eye redness/discharge  Neck: Neck supple.  Cardiovascular: Normal rate.  Pulmonary/Chest: No respiratory distress.  Abdominal: She exhibits no distension.  Musculoskeletal: Normal range of motion.  Neurological: She is alert and oriented to  person, place, and time.  Skin: Skin is warm and dry.  Few small, noninflamed excoriations around the entrance to the umbilicus, with a shallow erosion at the right sidewall.  This is nontender, no drainage expressible.  No fluctuance.  No erythema.  Abdomen is nontender.  Nursing note and vitals reviewed.    UC Treatments / Results  Labs (all labs ordered are listed, but only abnormal results are displayed) Labs Reviewed - No data to display  EKG None  Radiology No results found.  Procedures Procedures (including critical care time)  Medications Ordered in UC Medications - No data to display   Final Clinical Impressions(s) / UC Diagnoses   Final diagnoses:  Impetigo     Discharge Instructions     Rinse belly button area with water 1-2 times daily, apply thin smear of antibiotic ointment.  Recheck if any increasing redness/swelling/pain/drainage or new fever>100.5, or if not starting to improve in a few days.     ED Prescriptions    Medication Sig Dispense Auth. Provider   mupirocin ointment (BACTROBAN) 2 % Apply 1 application topically 2 (two) times daily for 7 days. 22 g Isa RankinMurray, Laura Wilson, MD        Isa RankinMurray, Laura Wilson, MD 05/16/18 [phone]

## 2018-05-25 DIAGNOSIS — Z6827 Body mass index (BMI) 27.0-27.9, adult: Secondary | ICD-10-CM | POA: Diagnosis not present

## 2018-05-25 DIAGNOSIS — Z01419 Encounter for gynecological examination (general) (routine) without abnormal findings: Secondary | ICD-10-CM | POA: Diagnosis not present

## 2018-07-11 DIAGNOSIS — K080 Exfoliation of teeth due to systemic causes: Secondary | ICD-10-CM | POA: Diagnosis not present

## 2018-08-15 DIAGNOSIS — Z23 Encounter for immunization: Secondary | ICD-10-CM | POA: Diagnosis not present

## 2018-09-18 ENCOUNTER — Ambulatory Visit: Payer: Federal, State, Local not specified - PPO | Admitting: Internal Medicine

## 2018-09-20 ENCOUNTER — Ambulatory Visit: Payer: Federal, State, Local not specified - PPO | Admitting: Internal Medicine

## 2018-09-20 ENCOUNTER — Encounter: Payer: Self-pay | Admitting: Internal Medicine

## 2018-09-20 VITALS — BP 120/78 | HR 75 | Ht 59.0 in | Wt 132.0 lb

## 2018-09-20 DIAGNOSIS — E039 Hypothyroidism, unspecified: Secondary | ICD-10-CM

## 2018-09-20 LAB — T4, FREE: Free T4: 1.07 ng/dL (ref 0.60–1.60)

## 2018-09-20 LAB — TSH: TSH: 2.08 u[IU]/mL (ref 0.35–4.50)

## 2018-09-20 MED ORDER — LEVOTHYROXINE SODIUM 50 MCG PO TABS: 50.0000 ug | ORAL_TABLET | Freq: Every day | ORAL | 3 refills | Status: DC

## 2018-09-20 NOTE — Progress Notes (Signed)
Patient ID: Ann Rogers, female   DOB: Apr 18, 1976, 42 y.o.   MRN: [id number]   HPI  Ann Rogers is a 42 y.o.-year-old female, returning for f/u for hypothyroidism, dx 07/2013. Last visit 1 year ago.  She is feeling well and has no complaints today other than fatigue as her daughter is still breast-feeding and waking up every 2 hours during the night.  She is 9 months old.  Reviewed and addended history: Pt. has been dx with hypothyroidism in 07/2013 (TSH high, but pt not told how high, and records not available) >> started on Levothyroxine 25 mcg.  During pregnancy, with increased the dose of her levothyroxine to 75 mcg daily and we decreased it back after pregnancy to 50 mcg daily.  Pt is on levothyroxine 50 mcg daily, taken: - Daily now, but was skipping doses at last visit - in am - fasting - at least 30 min from b'fast - no Ca, Fe, PPIs - + MVI at night - not on Biotin  Reviewed latest lab results which were normal: Lab Results  Component Value Date   TSH 1.71 04/13/2018   TSH 2.06 10/24/2017   TSH 1.68 05/30/2017   TSH 0.38 03/18/2017   TSH 1.33 01/28/2017   FREET4 0.99 04/13/2018   FREET4 0.94 10/24/2017   FREET4 1.02 05/30/2017   FREET4 1.08 03/18/2017   FREET4 1.00 01/28/2017   Her antithyroid antibodies were not elevated: Marland Kitchen Thyroid Peroxidase Antibody 10/19/2013 10.6  <35.0 IU/mL Final   Pt denies: - feeling nodules in neck - hoarseness - dysphagia - choking - SOB with lying down  ROS: Constitutional: + weight gain/no weight loss, + fatigue, no subjective hyperthermia, no subjective hypothermia Eyes: no blurry vision, no xerophthalmia ENT: no sore throat, + see HPI Cardiovascular: no CP/no SOB/no palpitations/no leg swelling Respiratory: no cough/no SOB/no wheezing Gastrointestinal: no N/no V/no D/no C/no acid reflux Musculoskeletal: no muscle aches/no joint aches Skin: no rashes, no hair loss Neurological: no tremors/no numbness/no  tingling/no dizziness  I reviewed pt's medications, allergies, PMH, social hx, family hx, and changes were documented in the history of present illness. Otherwise, unchanged from my initial visit note.  Past Medical History:  Diagnosis Date  . AMA (advanced maternal age) primigravida 35+   . Hypothyroidism   . IBS (irritable bowel syndrome)   . Slow transit constipation 06/06/2014   She has MiraLax it sounds like, she responds to intermittent Dulcolax, Linzess 290 mcg not helpful    Past Surgical History:  Procedure Laterality Date  . ANAL RECTAL MANOMETRY N/A 07/01/2014   Procedure: ANAL RECTAL MANOMETRY;  Surgeon: Iva Boop, MD;  Location: [facility];  Service: Endoscopy;  Laterality: N/A;  . egg preservation    . LASIK  2013   Social History   Socioeconomic History  . Marital status: Widowed    Spouse name: Not on file  . Number of children: 0  . Years of education: Not on file  . Highest education level: Not on file  Occupational History  . 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[employer]  Social Needs  . Financial resource strain: Not on file  . Food insecurity:    Worry: Not on file    Inability: Not on file  . Transportation needs:    Medical: Not on file    Non-medical: Not on file  Tobacco Use  . Smoking status: Never Smoker  . Smokeless tobacco: Never Used  Substance  and Sexual Activity  . Alcohol use: No  . Drug use: No  . Sexual activity: Not Currently  Lifestyle  . Physical activity:    Days per week: Not on file    Minutes per session: Not on file  . Stress: Not on file  Relationships  . Social connections:    Talks on phone: Not on file    Gets together: Not on file    Attends religious service: Not on file    Active member of club or organization: Not on file    Attends meetings of clubs or organizations: Not on file    Relationship status: Not on file  . Intimate partner violence:    Fear of current  or ex partner: Not on file    Emotionally abused: Not on file    Physically abused: Not on file    Forced sexual activity: Not on file  Other Topics Concern  . Not on file  Social History Narrative   She is single, no children lives in [place]    [employer]   Regular exercise: no   Caffeine use: coffee in the am; 3-4 sodas daily   Last updated 06/06/2014            Current Outpatient Medications on File Prior to Visit  Medication Sig Dispense Refill  . levothyroxine (SYNTHROID, LEVOTHROID) 50 MCG tablet TAKE 1 TABLET (50 MCG TOTAL) BY MOUTH DAILY. 60 tablet 5  . Prenatal Vit-Fe Fumarate-FA (PRENATAL VITAMIN) 27-0.8 MG TABS Take 1 tablet by mouth daily. 30 tablet 0  . Cholecalciferol (VITAMIN D PO) Take 1 tablet by mouth daily.    . Omega-3 Fatty Acids (OMEGA 3 PO) Take 3 capsules by mouth every evening.      No current facility-administered medications on file prior to visit.    No Known Allergies Family History  Problem Relation Age of Onset  . Stroke Mother   . Hypertension Father   . Hyperlipidemia Father   . Cancer Maternal Grandfather   . Hypertension Maternal Aunt   . Hyperlipidemia Maternal Aunt   . Miscarriages / [place] Cousin   . Colon cancer Neg Hx   . Colon polyps Neg Hx   . Esophageal cancer Neg Hx   . Kidney disease Neg Hx    PE: BP 120/78   Pulse 75   Ht 4\' 11"  (1.499 m)   Wt 132 lb (59.9 kg)   LMP 09/19/2018   SpO2 98%   Breastfeeding? Yes   BMI 26.66 kg/m  Body mass index is 26.66 kg/m. Wt Readings from Last 3 Encounters:  09/20/18 132 lb (59.9 kg)  05/12/18 125 lb (56.7 kg)  09/16/17 126 lb (57.2 kg)   Constitutional: Normal weight, in NAD Eyes: PERRLA, EOMI, no exophthalmos ENT: moist mucous membranes, no thyromegaly, no cervical lymphadenopathy Cardiovascular: RRR, No MRG Respiratory: CTA B Gastrointestinal: abdomen soft, NT, ND, BS+ Musculoskeletal: no deformities, strength intact in  all 4 Skin: moist, warm, no rashes Neurological: no tremor with outstretched hands, DTR normal in all 4  ASSESSMENT: 1. Hypothyroidism  PLAN:  1. Patient with mild hypothyroidism, on levothyroxine 50 mcg daily.  We decreased the dose from 75 mcg daily after her pregnancy.  Subsequent TFTs were normal.  At last visit, she was complaining of increased fatigue as her daughter was still small and was feeding throughout the night, every 2 hours (breast-feeding).  At this visit, she tells me that although her daughter is 49  months old, she is still following the same breast-feeding program.  She does not eat well during the day and only eats a handful of foods, preferring breastmilk.  We discussed about a possible referral to a pediatric nutritionist to help with this, as patient is exhausted and would like to be able to sleep better at night.  Her daughter is in the 6th percentile for weight. - latest thyroid labs reviewed with pt >> normal in 03/2018 - we discussed about taking the thyroid hormone every day, with water, >30 minutes before breakfast, separated by >4 hours from acid reflux medications, calcium, iron, multivitamins. Pt. is taking it correctly. - will check thyroid tests today: TSH and fT4 - If labs are abnormal, she will need to return for repeat TFTs in 1.5 months  - time spent with the patient: 15 min, of which >50% was spent in obtaining information about her symptoms, reviewing her previous labs, evaluations, and treatments, counseling her about her condition (please see the discussed topics above), and developing a plan to further investigate and treat it.  Needs 90 day refills.  Office Visit on 09/20/2018  Component Date Value Ref Range Status  . TSH 09/20/2018 2.08  0.35 - 4.50 uIU/mL Final  . Free T4 09/20/2018 1.07  0.60 - 1.60 ng/dL Final   Comment: Specimens from patients who are undergoing biotin therapy and /or ingesting biotin supplements may contain high levels of  biotin.  The higher biotin concentration in these specimens interferes with this Free T4 assay.  Specimens that contain high levels  of biotin may cause false high results for this Free T4 assay.  Please interpret results in light of the total clinical presentation of the patient.     TFTs are normal.  Carlus Pavlov, MD PhD [facility]

## 2018-09-20 NOTE — Patient Instructions (Signed)
Please continue Levothyroxine 50 mcg daily.  Take the thyroid hormone every day, with water, at least 30 minutes before breakfast, separated by at least 4 hours from: - acid reflux medications - calcium - iron - multivitamins  Please come back for a follow-up appointment in 1 year.   

## 2018-09-25 ENCOUNTER — Telehealth: Payer: Self-pay

## 2018-09-25 NOTE — Telephone Encounter (Signed)
Notified patient of message from Dr. Gherghe, patient expressed understanding and agreement. No further questions.  

## 2018-09-25 NOTE — Telephone Encounter (Signed)
-----   Message from Carlus Pavlov, MD sent at 09/20/2018 12:18 PM EDT ----- Efraim Kaufmann, can you please call pt: TFTs are normal.

## 2018-10-09 ENCOUNTER — Encounter: Payer: Federal, State, Local not specified - PPO | Admitting: Physician Assistant

## 2018-10-16 ENCOUNTER — Ambulatory Visit (INDEPENDENT_AMBULATORY_CARE_PROVIDER_SITE_OTHER): Payer: Federal, State, Local not specified - PPO | Admitting: Family Medicine

## 2018-10-16 ENCOUNTER — Encounter: Payer: Self-pay | Admitting: Family Medicine

## 2018-10-16 ENCOUNTER — Other Ambulatory Visit: Payer: Self-pay

## 2018-10-16 VITALS — BP 118/72 | HR 80 | Temp 98.6°F | Resp 16 | Ht 59.0 in | Wt 130.0 lb

## 2018-10-16 DIAGNOSIS — Z0000 Encounter for general adult medical examination without abnormal findings: Secondary | ICD-10-CM | POA: Diagnosis not present

## 2018-10-16 DIAGNOSIS — Z1239 Encounter for other screening for malignant neoplasm of breast: Secondary | ICD-10-CM | POA: Diagnosis not present

## 2018-10-16 DIAGNOSIS — E039 Hypothyroidism, unspecified: Secondary | ICD-10-CM

## 2018-10-16 DIAGNOSIS — Z1321 Encounter for screening for nutritional disorder: Secondary | ICD-10-CM | POA: Diagnosis not present

## 2018-10-16 NOTE — Progress Notes (Signed)
   Subjective:    Patient ID: Ann Rogers, female    DOB: 06/02/1976, 42 y.o.   MRN: [id number]  Patient presents for Annual Exam (is not fasting- would like breast exam)  Pt here for CPE  Medications reviewed Hypothyrpidism- followed by endocrinology on synthroid GYN- PAP Done by [facility]  Immunizations- TDAP UTD, Flu UTD  Due for Mammogram- but she does still breastfeed her 6420 month old , request breast exam, no specific concerns Family history reviewed   Due for fasting labs     Review Of Systems:  GEN- denies fatigue, fever, weight loss,weakness, recent illness HEENT- denies eye drainage, change in vision, nasal discharge, CVS- denies chest pain, palpitations RESP- denies SOB, cough, wheeze ABD- denies N/V, change in stools, abd pain GU- denies dysuria, hematuria, dribbling, incontinence MSK- denies joint pain, muscle aches, injury Neuro- denies headache, dizziness, syncope, seizure activity       Objective:    BP 118/72   Pulse 80   Temp 98.6 F (37 C) (Oral)   Resp 16   Ht 4\' 11"  (1.499 m)   Wt 130 lb (59 kg)   LMP 09/19/2018   SpO2 100%   BMI 26.26 kg/m  GEN- NAD, alert and oriented x3 HEENT- PERRL, EOMI, non injected sclera, pink conjunctiva, MMM, oropharynx clear Neck- Supple, no thyromegaly Breast- normal symmetry, no nipple inversion,no nipple drainage, no nodules or lumps felt Nodes- no axillary nodes  CVS- RRR, no murmur RESP-CTAB ABD-NABS,soft,NT,ND Psych- normal affect and mood  EXT- No edema Pulses- Radial, DP- 2+        Assessment & Plan:   ETOH/FALL/Depression screening is negative    Problem List Items Addressed This Visit      Unprioritized   Hypothyroidism    Followed by endocrinology, had recent labs        Other Visit Diagnoses    Routine general medical examination at a health care facility    -  Primary   CPE done, she is breastfeeding, called Breast center, their guidlines is for her to schedule  mammo if she will stop BF withing 6 months, pt voiced understandig   Relevant Orders   CBC with Differential/Platelet   Comprehensive metabolic panel   Lipid panel   Breast cancer screening       Relevant Orders   MS DIGITAL SCREENING TOMO BILATERAL   Encounter for vitamin deficiency screening       Relevant Orders   Vitamin D, 25-hydroxy      Note: This dictation was prepared with Dragon dictation along with smaller phrase technology. Any transcriptional errors that result from this process are unintentional.

## 2018-10-16 NOTE — Assessment & Plan Note (Signed)
Followed by endocrinology, had recent labs

## 2018-10-16 NOTE — Patient Instructions (Addendum)
I recommend eye visit once a year I recommend dental visit every 6 months Goal is to  Exercise 30 minutes 5 days a week We will send a letter with lab results  Schedule mammogram when you are done breast feeding F/U 1 year for physical

## 2018-10-17 LAB — COMPREHENSIVE METABOLIC PANEL
AG RATIO: 1.4 (calc) (ref 1.0–2.5)
ALBUMIN MSPROF: 4.1 g/dL (ref 3.6–5.1)
ALT: 18 U/L (ref 6–29)
AST: 17 U/L (ref 10–30)
Alkaline phosphatase (APISO): 51 U/L (ref 33–115)
BUN / CREAT RATIO: 8 (calc) (ref 6–22)
BUN: 6 mg/dL — ABNORMAL LOW (ref 7–25)
CHLORIDE: 104 mmol/L (ref 98–110)
CO2: 27 mmol/L (ref 20–32)
CREATININE: 0.8 mg/dL (ref 0.50–1.10)
Calcium: 9 mg/dL (ref 8.6–10.2)
GLOBULIN: 3 g/dL (ref 1.9–3.7)
GLUCOSE: 102 mg/dL — AB (ref 65–99)
Potassium: 4 mmol/L (ref 3.5–5.3)
SODIUM: 140 mmol/L (ref 135–146)
TOTAL PROTEIN: 7.1 g/dL (ref 6.1–8.1)
Total Bilirubin: 0.4 mg/dL (ref 0.2–1.2)

## 2018-10-17 LAB — LIPID PANEL
CHOL/HDL RATIO: 2.7 (calc) (ref <5.0)
Cholesterol: 147 mg/dL (ref <200)
HDL: 55 mg/dL (ref >50)
LDL Cholesterol (Calc): 81 mg/dL (calc)
Non-HDL Cholesterol (Calc): 92 mg/dL (calc) (ref <130)
Triglycerides: 39 mg/dL (ref <150)

## 2018-10-17 LAB — CBC WITH DIFFERENTIAL/PLATELET
BASOS ABS: 57 {cells}/uL (ref 0–200)
BASOS PCT: 0.4 %
EOS ABS: 100 {cells}/uL (ref 15–500)
EOS PCT: 0.7 %
HEMATOCRIT: 37.3 % (ref 35.0–45.0)
HEMOGLOBIN: 12.6 g/dL (ref 11.7–15.5)
LYMPHS ABS: 2231 {cells}/uL (ref 850–3900)
MCH: 29 pg (ref 27.0–33.0)
MCHC: 33.8 g/dL (ref 32.0–36.0)
MCV: 85.9 fL (ref 80.0–100.0)
MONOS PCT: 6.4 %
MPV: 10.2 fL (ref 7.5–12.5)
NEUTROS ABS: 10997 {cells}/uL — AB (ref 1500–7800)
Neutrophils Relative %: 76.9 %
Platelets: 268 10*3/uL (ref 140–400)
RBC: 4.34 10*6/uL (ref 3.80–5.10)
RDW: 12.2 % (ref 11.0–15.0)
Total Lymphocyte: 15.6 %
WBC mixed population: 915 cells/uL (ref 200–950)
WBC: 14.3 10*3/uL — ABNORMAL HIGH (ref 3.8–10.8)

## 2018-10-17 LAB — VITAMIN D 25 HYDROXY (VIT D DEFICIENCY, FRACTURES): VIT D 25 HYDROXY: 22 ng/mL — AB (ref 30–100)

## 2018-10-19 ENCOUNTER — Other Ambulatory Visit: Payer: Self-pay | Admitting: *Deleted

## 2018-10-19 MED ORDER — VITAMIN D (ERGOCALCIFEROL) 1.25 MG (50000 UNIT) PO CAPS: 50000.0000 [IU] | ORAL_CAPSULE | ORAL | 0 refills | Status: DC

## 2018-11-07 DIAGNOSIS — J9801 Acute bronchospasm: Secondary | ICD-10-CM | POA: Diagnosis not present

## 2018-11-07 DIAGNOSIS — J22 Unspecified acute lower respiratory infection: Secondary | ICD-10-CM | POA: Diagnosis not present

## 2018-11-16 DIAGNOSIS — R05 Cough: Secondary | ICD-10-CM | POA: Diagnosis not present

## 2019-01-12 ENCOUNTER — Other Ambulatory Visit: Payer: Self-pay | Admitting: Family Medicine

## 2019-02-11 ENCOUNTER — Other Ambulatory Visit: Payer: Self-pay | Admitting: Family Medicine

## 2019-05-31 DIAGNOSIS — Z6827 Body mass index (BMI) 27.0-27.9, adult: Secondary | ICD-10-CM | POA: Diagnosis not present

## 2019-05-31 DIAGNOSIS — Z01419 Encounter for gynecological examination (general) (routine) without abnormal findings: Secondary | ICD-10-CM | POA: Diagnosis not present

## 2019-06-20 DIAGNOSIS — L918 Other hypertrophic disorders of the skin: Secondary | ICD-10-CM | POA: Diagnosis not present

## 2019-08-20 DIAGNOSIS — Z23 Encounter for immunization: Secondary | ICD-10-CM | POA: Diagnosis not present

## 2019-09-13 ENCOUNTER — Other Ambulatory Visit: Payer: Self-pay

## 2019-09-17 ENCOUNTER — Ambulatory Visit: Payer: Federal, State, Local not specified - PPO | Admitting: Internal Medicine

## 2019-09-17 ENCOUNTER — Encounter: Payer: Self-pay | Admitting: Internal Medicine

## 2019-09-17 ENCOUNTER — Other Ambulatory Visit: Payer: Self-pay

## 2019-09-17 VITALS — BP 120/80 | HR 80 | Ht 59.0 in | Wt 138.0 lb

## 2019-09-17 DIAGNOSIS — E039 Hypothyroidism, unspecified: Secondary | ICD-10-CM | POA: Diagnosis not present

## 2019-09-17 LAB — T4, FREE: Free T4: 0.98 ng/dL (ref 0.60–1.60)

## 2019-09-17 LAB — TSH: TSH: 2.07 u[IU]/mL (ref 0.35–4.50)

## 2019-09-17 MED ORDER — LEVOTHYROXINE SODIUM 50 MCG PO TABS: 50.0000 ug | ORAL_TABLET | Freq: Every day | ORAL | 3 refills | Status: DC

## 2019-09-17 NOTE — Progress Notes (Signed)
Patient ID: SENORA LACSON, female   DOB: [birth year], 43 y.o.   MRN: [id number]   HPI  DORTHEA MAINA is a 43 y.o.-year-old female, returning for f/u for hypothyroidism, dx 07/2013. Last visit 1 year ago.  At last visit, she had significant fatigue as her daughter was still breast-feeding and waking up every 2 hours during the night.  She is still breast-feeding 1-2 times a night. Her daughter is 62.57 years old.  Reviewed and addended history: Pt. has been dx with hypothyroidism in 07/2013 (TSH high, but pt not told how high, and records not available) >> started on Levothyroxine 25 mcg.  During pregnancy, with increased the dose of her levothyroxine to 75 mcg daily and we decreased it back after the pregnancy to 50 mcg daily.  Pt is on levothyroxine 50 mcg daily, taken: - in am - fasting - at least 30 min from b'fast - no Ca, Fe, PPIs - + MVIs at night - not on Biotin  TFTs were reviewed and these were normal: Lab Results  Component Value Date   TSH 2.08 09/20/2018   TSH 1.71 04/13/2018   TSH 2.06 10/24/2017   TSH 1.68 05/30/2017   TSH 0.38 03/18/2017   FREET4 1.07 09/20/2018   FREET4 0.99 04/13/2018   FREET4 0.94 10/24/2017   FREET4 1.02 05/30/2017   FREET4 1.08 03/18/2017   Her antithyroid antibodies were not elevated: Marland Kitchen Thyroid Peroxidase Antibody 10/19/2013 10.6  <35.0 IU/mL Final   Pt denies: - feeling nodules in neck - hoarseness - dysphagia - choking - SOB with lying down  No FH of thyroid ds. No FH of thyroid cancer. No h/o radiation tx to head or neck.  No seaweed or kelp. No recent contrast studies. No herbal supplements. No Biotin use. No recent steroids use.   ROS: Constitutional:+ weight gain/no weight loss, no fatigue, no subjective hyperthermia, no subjective hypothermia Eyes: no blurry vision, no xerophthalmia ENT: no sore throat, + see HPI Cardiovascular: no CP/no SOB/no palpitations/no leg swelling Respiratory: no cough/no SOB/no  wheezing Gastrointestinal: no N/no V/no D/no C/no acid reflux Musculoskeletal: no muscle aches/no joint aches Skin: no rashes, no hair loss Neurological: no tremors/no numbness/no tingling/no dizziness  I reviewed pt's medications, allergies, PMH, social hx, family hx, and changes were documented in the history of present illness. Otherwise, unchanged from my initial visit note.  Past Medical History:  Diagnosis Date  . AMA (advanced maternal age) primigravida 40+   . Hypothyroidism   . IBS (irritable bowel syndrome)   . Slow transit constipation 06/06/2014   She has MiraLax it sounds like, she responds to intermittent Dulcolax, Linzess 290 mcg not helpful    Past Surgical History:  Procedure Laterality Date  . ANAL RECTAL MANOMETRY N/A 07/01/2014   Procedure: ANAL RECTAL MANOMETRY;  Surgeon: Gatha Mayer, MD;  Location: [facility];  Service: Endoscopy;  Laterality: N/A;  . egg preservation    . LASIK  2013   Social History   Socioeconomic History  . Marital status: Widowed    Spouse name: Not on file  . Number of children: 0  . Years of education: Not on file  . Highest education level: Not on file  Occupational History  . Occupation: Surveyor, quantity at [employer]: [employer]  . Financial resource strain: Not on file  . Food insecurity    Worry: Not on file    Inability: Not on file  .  Transportation needs    Medical: Not on file    Non-medical: Not on file  Tobacco Use  . Smoking status: Never Smoker  . Smokeless tobacco: Never Used  Substance and Sexual Activity  . Alcohol use: No  . Drug use: No  . Sexual activity: Not Currently  Lifestyle  . Physical activity    Days per week: Not on file    Minutes per session: Not on file  . Stress: Not on file  Relationships  . Social Musician on phone: Not on file    Gets together: Not on file    Attends religious service: Not on file    Active member of  club or organization: Not on file    Attends meetings of clubs or organizations: Not on file    Relationship status: Not on file  . Intimate partner violence    Fear of current or ex partner: Not on file    Emotionally abused: Not on file    Physically abused: Not on file    Forced sexual activity: Not on file  Other Topics Concern  . Not on file  Social History Narrative   She is single, no children lives in [place]    [employer]   Regular exercise: no   Caffeine use: coffee in the am; 3-4 sodas daily   Last updated 06/06/2014            Current Outpatient Medications on File Prior to Visit  Medication Sig Dispense Refill  . levothyroxine (SYNTHROID, LEVOTHROID) 50 MCG tablet Take 1 tablet (50 mcg total) by mouth daily. 90 tablet 3  . Omega-3 Fatty Acids (OMEGA 3 PO) Take 3 capsules by mouth every evening.     . Prenatal Vit-Fe Fumarate-FA (PRENATAL VITAMIN) 27-0.8 MG TABS Take 1 tablet by mouth daily. 30 tablet 0  . Vitamin D, Ergocalciferol, (DRISDOL) 1.25 MG (50000 UT) CAPS capsule Take 1 capsule (50,000 Units total) by mouth every 7 (seven) days. x12 weeks. Then D/C. Begin OTC VIt D once prescription is complete. 12 capsule 0   No current facility-administered medications on file prior to visit.    No Known Allergies Family History  Problem Relation Age of Onset  . Stroke Mother   . Hypertension Father   . Hyperlipidemia Father   . Cancer Maternal Grandfather   . Hypertension Maternal Aunt   . Hyperlipidemia Maternal Aunt   . Miscarriages / [place] Cousin   . Colon cancer Neg Hx   . Colon polyps Neg Hx   . Esophageal cancer Neg Hx   . Kidney disease Neg Hx    PE: BP 120/80   Pulse 80   Ht 4\' 11"  (1.499 m) Comment: measured today without shoes  Wt 138 lb (62.6 kg)   LMP 08/30/2019   SpO2 99%   Breastfeeding Yes   BMI 27.87 kg/m  Body mass index is 27.87 kg/m. Wt Readings from Last 3 Encounters:  09/17/19  138 lb (62.6 kg)  10/16/18 130 lb (59 kg)  09/20/18 132 lb (59.9 kg)   Constitutional: normal weight, in NAD Eyes: PERRLA, EOMI, no exophthalmos ENT: moist mucous membranes, no thyromegaly, no cervical lymphadenopathy Cardiovascular: RRR, No MRG Respiratory: CTA B Gastrointestinal: abdomen soft, NT, ND, BS+ Musculoskeletal: no deformities, strength intact in all 4 Skin: moist, warm, no rashes Neurological: no tremor with outstretched hands, DTR normal in all 4  ASSESSMENT: 1. Hypothyroidism  PLAN:  1. Patient with mild  hypothyroidism, on low-dose levothyroxine - latest thyroid labs reviewed with pt >> normal at last visit - she continues on LT4 50 mcg daily.  We decreased the dose from 75 mcg daily after her pregnancy. - pt feels good on this dose.  She gained weight, which she attributes to eating more due to breast-feeding.  At last visit, she had significant fatigue but this was likely due to still breast-feeding her daughter throughout the night even though she was 4720 months old.  At that time, her daughter was in the 6th percentile for weight.  Since then, she hired a Health and safety inspectornutritionist that works with her.  She is only breast-feeding 1-2 times a night now working on stopping breast-feeding soon. - we discussed about taking the thyroid hormone every day, with water, >30 minutes before breakfast, separated by >4 hours from acid reflux medications, calcium, iron, multivitamins. Pt. is taking it correctly. - will check thyroid tests today: TSH and fT4 - If labs are abnormal, she will need to return for repeat TFTs in 1.5 months  - time spent with the patient: 15 min, of which >50% was spent in obtaining information about her symptoms, reviewing her previous labs, evaluations, and treatments, counseling her about her condition (please see the discussed topics above), and developing a plan to further investigate and treat it; she had a number of questions which I addressed.  Needs 90 day  refills.  Office Visit on 09/17/2019  Component Date Value Ref Range Status  . TSH 09/17/2019 2.07  0.35 - 4.50 uIU/mL Final  . Free T4 09/17/2019 0.98  0.60 - 1.60 ng/dL Final   Comment: Specimens from patients who are undergoing biotin therapy and /or ingesting biotin supplements may contain high levels of biotin.  The higher biotin concentration in these specimens interferes with this Free T4 assay.  Specimens that contain high levels  of biotin may cause false high results for this Free T4 assay.  Please interpret results in light of the total clinical presentation of the patient.     Normal TFTs.  Carlus Pavlovristina Cheynne Virden, MD PhD [facility]

## 2019-09-17 NOTE — Patient Instructions (Signed)
Please continue Levothyroxine 50 mcg daily.  Take the thyroid hormone every day, with water, at least 30 minutes before breakfast, separated by at least 4 hours from: - acid reflux medications - calcium - iron - multivitamins  Please stop at the lab.  Please come back for a follow-up appointment in 1 year. 

## 2019-10-19 ENCOUNTER — Other Ambulatory Visit: Payer: Self-pay

## 2019-10-22 ENCOUNTER — Other Ambulatory Visit: Payer: Self-pay

## 2019-10-22 ENCOUNTER — Ambulatory Visit (INDEPENDENT_AMBULATORY_CARE_PROVIDER_SITE_OTHER): Payer: Federal, State, Local not specified - PPO | Admitting: Family Medicine

## 2019-10-22 ENCOUNTER — Encounter: Payer: Self-pay | Admitting: Family Medicine

## 2019-10-22 VITALS — BP 122/68 | HR 82 | Temp 98.3°F | Resp 14 | Ht 59.0 in | Wt 138.0 lb

## 2019-10-22 DIAGNOSIS — Z0000 Encounter for general adult medical examination without abnormal findings: Secondary | ICD-10-CM | POA: Diagnosis not present

## 2019-10-22 DIAGNOSIS — Z1321 Encounter for screening for nutritional disorder: Secondary | ICD-10-CM | POA: Diagnosis not present

## 2019-10-22 DIAGNOSIS — E559 Vitamin D deficiency, unspecified: Secondary | ICD-10-CM | POA: Diagnosis not present

## 2019-10-22 NOTE — Progress Notes (Signed)
   Subjective:    Patient ID: Ann Rogers, female    DOB: January 04, 1976, 43 y.o.   MRN: [id number]  Patient presents for Annual Exam (is fasting)  Patient here to follow-up chronic medical problems and for her annual exam.  Medications reviewed.  She has been doing well, no recent flares   Thyroidism she is currently on levothyroxine 50 mcg once a day, she is followed by endocrinology, last visit in December   She still breast-feeding, she is on prenatal vitamin.  No mammogram in the setting of still breast-feeding and producing milk.  Pap smear last done in 2020, had GYN visit in July ( [facility])  Immunizations-  tetanus up-to-date / flu shot  She admits she is drinking lot of soda she has not been exercising    Review Of Systems:  GEN- denies fatigue, fever, weight loss,weakness, recent illness HEENT- denies eye drainage, change in vision, nasal discharge, CVS- denies chest pain, palpitations RESP- denies SOB, cough, wheeze ABD- denies N/V, change in stools, abd pain GU- denies dysuria, hematuria, dribbling, incontinence MSK- denies joint pain, muscle aches, injury Neuro- denies headache, dizziness, syncope, seizure activity       Objective:    BP 122/68   Pulse 82   Temp 98.3 F (36.8 C) (Temporal)   Resp 14   Ht 4\' 11"  (1.499 m)   Wt 138 lb (62.6 kg)   LMP 09/26/2019 Comment: reguar  SpO2 98%   Breastfeeding Yes   BMI 27.87 kg/m  GEN- NAD, alert and oriented x3 HEENT- PERRL, EOMI, non injected sclera, pink conjunctiva, MMM, oropharynx clear, TM clear bilateral nares clear Neck- Supple, no thyromegaly CVS- RRR, no murmur RESP-CTAB ABD-NABS,soft,NT,ND Psych normal affect and mood EXT- No edema Pulses- Radial, DP- 2+        Assessment & Plan:      Problem List Items Addressed This Visit    None    Visit Diagnoses    Routine general medical examination at a health care facility    -  Primary   CPE done.  Immunizations up-to-date  fasting labs will need vitamin D replacement discussed dietary changes exercise   Relevant Orders   CBC with Differential   Comprehensive metabolic panel   Lipid Panel   Encounter for vitamin deficiency screening       Relevant Orders   Vitamin D, 25-hydroxy      Note: This dictation was prepared with Dragon dictation along with smaller phrase technology. Any transcriptional errors that result from this process are unintentional.

## 2019-10-22 NOTE — Patient Instructions (Signed)
F/U 1 year for Physical  

## 2019-10-23 LAB — CBC WITH DIFFERENTIAL/PLATELET
Absolute Monocytes: 516 cells/uL (ref 200–950)
Basophils Absolute: 54 cells/uL (ref 0–200)
Basophils Relative: 0.7 %
Eosinophils Absolute: 123 cells/uL (ref 15–500)
Eosinophils Relative: 1.6 %
HCT: 37.9 % (ref 35.0–45.0)
Hemoglobin: 12.5 g/dL (ref 11.7–15.5)
Lymphs Abs: 1956 cells/uL (ref 850–3900)
MCH: 28.9 pg (ref 27.0–33.0)
MCHC: 33 g/dL (ref 32.0–36.0)
MCV: 87.7 fL (ref 80.0–100.0)
MPV: 10.4 fL (ref 7.5–12.5)
Monocytes Relative: 6.7 %
Neutro Abs: 5051 cells/uL (ref 1500–7800)
Neutrophils Relative %: 65.6 %
Platelets: 271 10*3/uL (ref 140–400)
RBC: 4.32 10*6/uL (ref 3.80–5.10)
RDW: 11.8 % (ref 11.0–15.0)
Total Lymphocyte: 25.4 %
WBC: 7.7 10*3/uL (ref 3.8–10.8)

## 2019-10-23 LAB — LIPID PANEL
Cholesterol: 181 mg/dL (ref <200)
HDL: 61 mg/dL (ref >=50)
LDL Cholesterol (Calc): 106 mg/dL (calc) — ABNORMAL HIGH
Non-HDL Cholesterol (Calc): 120 mg/dL (calc) (ref <130)
Total CHOL/HDL Ratio: 3 (calc) (ref <5.0)
Triglycerides: 48 mg/dL (ref <150)

## 2019-10-23 LAB — COMPREHENSIVE METABOLIC PANEL
AG Ratio: 1.5 (calc) (ref 1.0–2.5)
ALT: 12 U/L (ref 6–29)
AST: 17 U/L (ref 10–30)
Albumin: 4 g/dL (ref 3.6–5.1)
Alkaline phosphatase (APISO): 44 U/L (ref 31–125)
BUN: 10 mg/dL (ref 7–25)
CO2: 26 mmol/L (ref 20–32)
Calcium: 8.7 mg/dL (ref 8.6–10.2)
Chloride: 105 mmol/L (ref 98–110)
Creat: 0.84 mg/dL (ref 0.50–1.10)
Globulin: 2.7 g/dL (calc) (ref 1.9–3.7)
Glucose, Bld: 88 mg/dL (ref 65–99)
Potassium: 4.3 mmol/L (ref 3.5–5.3)
Sodium: 138 mmol/L (ref 135–146)
Total Bilirubin: 0.5 mg/dL (ref 0.2–1.2)
Total Protein: 6.7 g/dL (ref 6.1–8.1)

## 2019-10-23 LAB — VITAMIN D 25 HYDROXY (VIT D DEFICIENCY, FRACTURES): Vit D, 25-Hydroxy: 21 ng/mL — ABNORMAL LOW (ref 30–100)

## 2019-10-31 ENCOUNTER — Other Ambulatory Visit: Payer: Self-pay | Admitting: *Deleted

## 2019-10-31 MED ORDER — VITAMIN D (ERGOCALCIFEROL) 1.25 MG (50000 UNIT) PO CAPS: 50000.0000 [IU] | ORAL_CAPSULE | ORAL | 0 refills | Status: DC

## 2020-01-18 ENCOUNTER — Other Ambulatory Visit: Payer: Self-pay | Admitting: Family Medicine

## 2020-01-18 MED ORDER — VITAMIN D 50 MCG (2000 UT) PO CAPS: 1.0000 | ORAL_CAPSULE | Freq: Every day | ORAL | 11 refills | Status: DC

## 2020-02-09 ENCOUNTER — Other Ambulatory Visit: Payer: Self-pay

## 2020-02-09 ENCOUNTER — Ambulatory Visit: Payer: Federal, State, Local not specified - PPO | Attending: Internal Medicine

## 2020-02-09 DIAGNOSIS — Z23 Encounter for immunization: Secondary | ICD-10-CM

## 2020-02-09 NOTE — Progress Notes (Signed)
   Covid-19 Vaccination Clinic  Name:  Ann Rogers    MRN: [id number] DOB: 12-Jul-1976  02/09/2020  Ann Rogers was observed post Covid-19 immunization for 15 minutes without incident. She was provided with Vaccine Information Sheet and instruction to access the V-Safe system.   Ann Rogers was instructed to call 911 with any severe reactions post vaccine: Marland Kitchen Difficulty breathing  . Swelling of face and throat  . A fast heartbeat  . A bad rash all over body  . Dizziness and weakness   Immunizations Administered    Name Date Dose VIS Date Route   Pfizer COVID-19 Vaccine 02/09/2020 10:05 AM 0.3 mL 11/09/2019 Intramuscular   Manufacturer: [employer], [employer]   Lot: [id number]   NDC: [address]-3

## 2020-02-10 ENCOUNTER — Encounter: Payer: Self-pay | Admitting: Emergency Medicine

## 2020-02-10 ENCOUNTER — Other Ambulatory Visit: Payer: Self-pay

## 2020-02-10 ENCOUNTER — Ambulatory Visit
Admission: EM | Admit: 2020-02-10 | Discharge: 2020-02-10 | Disposition: A | Discharge status: Home / Self Care | Payer: Federal, State, Local not specified - PPO | Attending: Family Medicine | Admitting: Family Medicine

## 2020-02-10 DIAGNOSIS — J0100 Acute maxillary sinusitis, unspecified: Secondary | ICD-10-CM

## 2020-02-10 MED ORDER — AMOXICILLIN 875 MG PO TABS: 875.0000 mg | ORAL_TABLET | Freq: Two times a day (BID) | ORAL | 0 refills | Status: DC

## 2020-02-10 NOTE — Discharge Instructions (Signed)
Flonase or Nasacort nasal spray °

## 2020-02-10 NOTE — ED Provider Notes (Signed)
[facility]    CSN: [id number] Arrival date & time: 02/10/20  4098      History   Chief Complaint Chief Complaint  Patient presents with  . Sinus Problem  . Nasal Congestion    HPI Ann Rogers is a 44 y.o. female.   44 yo female with a c/o sinus pressure, nasal congestion, and sinus headache for one week. Denies any fevers, chills, body aches, cough, shortness of breath. No known sick contacts.    Sinus Problem    Past Medical History:  Diagnosis Date  . AMA (advanced maternal age) primigravida 23+   . Hypothyroidism   . IBS (irritable bowel syndrome)   . Slow transit constipation 06/06/2014   She has MiraLax it sounds like, she responds to intermittent Dulcolax, Linzess 290 mcg not helpful     Patient Active Problem List   Diagnosis Date Noted  . Abnormal transaminases - resolved 01/22/2016  . Slow transit constipation 06/06/2014  . 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 Hypothyroidism 10/19/2013    Past Surgical History:  Procedure Laterality Date  . ANAL RECTAL MANOMETRY N/A 07/01/2014   Procedure: ANAL RECTAL MANOMETRY;  Surgeon: Gatha Mayer, MD;  Location: [facility];  Service: Endoscopy;  Laterality: N/A;  . egg preservation    . LASIK  2013    OB History    Gravida  1   Para  1   Term  1   Preterm      AB      Living  1     SAB      TAB      Ectopic      Multiple  0   Live Births  1            Home Medications    Prior to Admission medications   Medication Sig Start Date End Date Taking? Authorizing Provider  levothyroxine (SYNTHROID) 50 MCG tablet Take 1 tablet (50 mcg total) by mouth daily. 09/17/19  Yes Philemon Kingdom, MD  Omega-3 Fatty Acids (OMEGA 3 PO) Take 3 capsules by mouth every evening.    Yes [provider]  Prenatal Vit-Fe Fumarate-FA (PRENATAL VITAMIN) 27-0.8 MG TABS Take 1 tablet by mouth daily. 07/05/17  Yes [place], Modena Nunnery, MD  amoxicillin (AMOXIL) 875 MG tablet Take 1 tablet  (875 mg total) by mouth 2 (two) times daily. 02/10/20   Norval Gable, MD  Cholecalciferol (VITAMIN D) 50 MCG (2000 UT) CAPS Take 1 capsule (2,000 Units total) by mouth daily. 01/18/20   Alycia Rossetti, MD    Family History Family History  Problem Relation Age of Onset  . Stroke Mother   . Hypertension Father   . Hyperlipidemia Father   . Cancer Maternal Grandfather   . Hypertension Maternal Aunt   . Hyperlipidemia Maternal Aunt   . Miscarriages / [place] Cousin   . Colon cancer Neg Hx   . Colon polyps Neg Hx   . Esophageal cancer Neg Hx   . Kidney disease Neg Hx     Social History Social History   Tobacco Use  . Smoking status: Never Smoker  . Smokeless tobacco: Never Used  Substance Use Topics  . Alcohol use: No  . Drug use: No     Allergies   Patient has no known allergies.   Review of Systems Review of Systems   Physical Exam Triage Vital Signs ED Triage Vitals  Enc Vitals Group     BP 02/10/20 0852 125/85  Pulse Rate 02/10/20 0852 82     Resp 02/10/20 0852 14     Temp 02/10/20 0852 98.2 F (36.8 C)     Temp Source 02/10/20 0852 Oral     SpO2 02/10/20 0852 100 %     Weight 02/10/20 0849 138 lb (62.6 kg)     Height 02/10/20 0849 4\' 11"  (1.499 m)     Head Circumference --      Peak Flow --      Pain Score 02/10/20 0849 3     Pain Loc --      Pain Edu? --      Excl. in GC? --    No data found.  Updated Vital Signs BP 125/85 (BP Location: Left Arm)   Pulse 82   Temp 98.2 F (36.8 C) (Oral)   Resp 14   Ht 4\' 11"  (1.499 m)   Wt 62.6 kg   LMP 02/03/2020 (Exact Date)   SpO2 100%   Breastfeeding Yes   BMI 27.87 kg/m   Visual Acuity Right Eye Distance:   Left Eye Distance:   Bilateral Distance:    Right Eye Near:   Left Eye Near:    Bilateral Near:     Physical Exam Vitals and nursing note reviewed.  Constitutional:      General: She is not in acute distress.    Appearance: She is not toxic-appearing or diaphoretic.  HENT:       Right Ear: Tympanic membrane normal.     Left Ear: Tympanic membrane normal.     Nose: Congestion and rhinorrhea present.     Right Sinus: Maxillary sinus tenderness present.     Left Sinus: Maxillary sinus tenderness present.     Mouth/Throat:     Pharynx: Posterior oropharyngeal erythema present. No oropharyngeal exudate.     Comments: Post nasal drainage Cardiovascular:     Rate and Rhythm: Normal rate.  Pulmonary:     Effort: Pulmonary effort is normal. No respiratory distress.  Neurological:     Mental Status: She is alert.      UC Treatments / Results  Labs (all labs ordered are listed, but only abnormal results are displayed) Labs Reviewed - No data to display  EKG   Radiology No results found.  Procedures Procedures (including critical care time)  Medications Ordered in UC Medications - No data to display  Initial Impression / Assessment and Plan / UC Course  I have reviewed the triage vital signs and the nursing notes.  Pertinent labs & imaging results that were available during my care of the patient were reviewed by me and considered in my medical decision making (see chart for details).      Final Clinical Impressions(s) / UC Diagnoses   Final diagnoses:  Acute maxillary sinusitis, recurrence not specified     Discharge Instructions     Flonase or Nasacort nasal spray    ED Prescriptions    Medication Sig Dispense Auth. Provider   amoxicillin (AMOXIL) 875 MG tablet Take 1 tablet (875 mg total) by mouth 2 (two) times daily. 20 tablet , MD      1. diagnosis reviewed with patient 2. rx as per orders above; reviewed possible side effects, interactions, risks and benefits  3. Recommend supportive treatment as above 4. Follow-up prn if symptoms worsen or don't improve   PDMP not reviewed this encounter.   04/04/2020, MD 02/10/20 [phone]

## 2020-02-10 NOTE — ED Triage Notes (Signed)
Patient c/o sinus pressure, nasal congestion and HA for a week.  Patient denies fevers.  Patient states that she is breastfeeding.

## 2020-03-04 ENCOUNTER — Ambulatory Visit: Payer: Federal, State, Local not specified - PPO | Attending: Internal Medicine

## 2020-03-04 DIAGNOSIS — Z23 Encounter for immunization: Secondary | ICD-10-CM

## 2020-03-04 NOTE — Progress Notes (Signed)
   Covid-19 Vaccination Clinic  Name:  HIBAH ODONNELL    MRN: [id number] DOB: 05-12-1976  03/04/2020  Ms. Cavendish was observed post Covid-19 immunization for 15 minutes without incident. She was provided with Vaccine Information Sheet and instruction to access the V-Safe system.   Ms. Cogliano was instructed to call 911 with any severe reactions post vaccine: Marland Kitchen Difficulty breathing  . Swelling of face and throat  . A fast heartbeat  . A bad rash all over body  . Dizziness and weakness   Immunizations Administered    Name Date Dose VIS Date Route   Pfizer COVID-19 Vaccine 03/04/2020 11:53 AM 0.3 mL 11/09/2019 Intramuscular   Manufacturer: [employer], [employer]   Lot: [id number]   NDC: [address]-0

## 2020-06-12 DIAGNOSIS — Z01419 Encounter for gynecological examination (general) (routine) without abnormal findings: Secondary | ICD-10-CM | POA: Diagnosis not present

## 2020-07-11 DIAGNOSIS — L245 Irritant contact dermatitis due to other chemical products: Secondary | ICD-10-CM | POA: Diagnosis not present

## 2020-07-25 ENCOUNTER — Other Ambulatory Visit: Payer: Self-pay | Admitting: Internal Medicine

## 2020-09-22 ENCOUNTER — Other Ambulatory Visit: Payer: Self-pay

## 2020-09-22 ENCOUNTER — Ambulatory Visit: Payer: Federal, State, Local not specified - PPO | Admitting: Internal Medicine

## 2020-09-22 ENCOUNTER — Encounter: Payer: Self-pay | Admitting: Internal Medicine

## 2020-09-22 VITALS — BP 120/80 | HR 75 | Ht 59.0 in | Wt 128.2 lb

## 2020-09-22 DIAGNOSIS — E039 Hypothyroidism, unspecified: Secondary | ICD-10-CM

## 2020-09-22 LAB — T4, FREE: Free T4: 1.13 ng/dL (ref 0.60–1.60)

## 2020-09-22 LAB — TSH: TSH: 2 u[IU]/mL (ref 0.35–4.50)

## 2020-09-22 NOTE — Progress Notes (Signed)
Patient ID: DAWNN NAM, female   DOB: 1976/01/22, 44 y.o.   MRN: [id number]  This visit occurred during the SARS-CoV-2 public health emergency.  Safety protocols were in place, including screening questions prior to the visit, additional usage of staff PPE, and extensive cleaning of exam room while observing appropriate contact time as indicated for disinfecting solutions.   HPI  Ann Rogers is a 44 y.o.-year-old female, returning for f/u for hypothyroidism, dx 07/2013. Last visit 1 year ago.  At last visit, she still had significant fatigue and she was still breast-feeding her daughter 1-2 times a night.  She stopped since then.  She is sleeping a little better. She also lost weight on Optivia diet  - stopped 06/2020.  Reviewed history: Pt. has been dx with hypothyroidism in 07/2013 (TSH high, but pt not told how high, and records not available) >> started on Levothyroxine 25 mcg.  During pregnancy, with increased the dose of her levothyroxine to 75 mcg daily and we decreased it back after the pregnancy to 50 mcg daily.  Pt is on levothyroxine 50 mcg daily, taken: - in am - fasting - at least 30 min from b'fast - no Ca, Fe, PPIs - + MVIs at night - not on Biotin  TFTs were normal: Lab Results  Component Value Date   TSH 2.07 09/17/2019   TSH 2.08 09/20/2018   TSH 1.71 04/13/2018   TSH 2.06 10/24/2017   TSH 1.68 05/30/2017   FREET4 0.98 09/17/2019   FREET4 1.07 09/20/2018   FREET4 0.99 04/13/2018   FREET4 0.94 10/24/2017   FREET4 1.02 05/30/2017   Her thyroid antibodies were not elevated: Marland Kitchen Thyroid Peroxidase Antibody 10/19/2013 10.6  <35.0 IU/mL Final   Pt denies: - feeling nodules in neck - hoarseness - dysphagia - choking - SOB with lying down  No FH of thyroid disease or thyroid cancer. No h/o radiation tx to head or neck.  No herbal supplements. No Biotin use. No recent steroids use.   ROS: Constitutional: no weight gain/+ weight loss, no fatigue,  no subjective hyperthermia, no subjective hypothermia Eyes: no blurry vision, no xerophthalmia ENT: no sore throat, + see HPI Cardiovascular: no CP/no SOB/no palpitations/no leg swelling Respiratory: no cough/no SOB/no wheezing Gastrointestinal: no N/no V/no D/no C/no acid reflux Musculoskeletal: no muscle aches/no joint aches Skin: no rashes, no hair loss Neurological: no tremors/no numbness/no tingling/no dizziness  I reviewed pt's medications, allergies, PMH, social hx, family hx, and changes were documented in the history of present illness. Otherwise, unchanged from my initial visit note.  Past Medical History:  Diagnosis Date  . AMA (advanced maternal age) primigravida 35+   . Hypothyroidism   . IBS (irritable bowel syndrome)   . Slow transit constipation 06/06/2014   She has MiraLax it sounds like, she responds to intermittent Dulcolax, Linzess 290 mcg not helpful    Past Surgical History:  Procedure Laterality Date  . ANAL RECTAL MANOMETRY N/A 07/01/2014   Procedure: ANAL RECTAL MANOMETRY;  Surgeon: Iva Boop, MD;  Location: [facility];  Service: Endoscopy;  Laterality: N/A;  . egg preservation    . LASIK  2013   Social History   Socioeconomic History  . Marital status: Widowed    Spouse name: Not on file  . Number of children: 0  . Years of education: Not on file  . Highest education level: Not on file  Occupational History  . 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[employer]  Tobacco Use  . Smoking status: Never Smoker  . Smokeless tobacco: Never Used  Vaping Use  . Vaping Use: Never used  Substance and Sexual Activity  . Alcohol use: No  . Drug use: No  . Sexual activity: Not Currently  Other Topics Concern  . Not on file  Social History Narrative   She is single, no children lives in [place]    [employer]   Regular exercise: no   Caffeine use: coffee in the am; 3-4  sodas daily   Last updated 06/06/2014            Social Determinants of Health   Financial Resource Strain:   . Difficulty of Paying Living Expenses: Not on file  Food Insecurity:   . Worried About Programme researcher, broadcasting/film/video in the Last Year: Not on file  . Ran Out of Food in the Last Year: Not on file  Transportation Needs:   . Lack of Transportation (Medical): Not on file  . Lack of Transportation (Non-Medical): Not on file  Physical Activity:   . Days of Exercise per Week: Not on file  . Minutes of Exercise per Session: Not on file  Stress:   . Feeling of Stress : Not on file  Social Connections:   . Frequency of Communication with Friends and Family: Not on file  . Frequency of Social Gatherings with Friends and Family: Not on file  . Attends Religious Services: Not on file  . Active Member of Clubs or Organizations: Not on file  . Attends Banker Meetings: Not on file  . Marital Status: Not on file  Intimate Partner Violence:   . Fear of Current or Ex-Partner: Not on file  . Emotionally Abused: Not on file  . Physically Abused: Not on file  . Sexually Abused: Not on file   Current Outpatient Medications on File Prior to Visit  Medication Sig Dispense Refill  . amoxicillin (AMOXIL) 875 MG tablet Take 1 tablet (875 mg total) by mouth 2 (two) times daily. 20 tablet 0  . Cholecalciferol (VITAMIN D) 50 MCG (2000 UT) CAPS Take 1 capsule (2,000 Units total) by mouth daily. 30 capsule 11  . levothyroxine (SYNTHROID) 50 MCG tablet TAKE 1 TABLET BY MOUTH EVERY DAY 90 tablet 3  . Omega-3 Fatty Acids (OMEGA 3 PO) Take 3 capsules by mouth every evening.     . Prenatal Vit-Fe Fumarate-FA (PRENATAL VITAMIN) 27-0.8 MG TABS Take 1 tablet by mouth daily. 30 tablet 0   No current facility-administered medications on file prior to visit.   No Known Allergies Family History  Problem Relation Age of Onset  . Stroke Mother   . Hypertension Father   . Hyperlipidemia Father   .  Cancer Maternal Grandfather   . Hypertension Maternal Aunt   . Hyperlipidemia Maternal Aunt   . Miscarriages / [place] Cousin   . Colon cancer Neg Hx   . Colon polyps Neg Hx   . Esophageal cancer Neg Hx   . Kidney disease Neg Hx    PE: BP 120/80   Pulse 75   Ht 4\' 11"  (1.499 m)   Wt 128 lb 3.2 oz (58.2 kg)   SpO2 98%   BMI 25.89 kg/m  Body mass index is 25.89 kg/m. Wt Readings from Last 3 Encounters:  09/22/20 128 lb 3.2 oz (58.2 kg)  02/10/20 138 lb (62.6 kg)  10/22/19 138 lb (62.6 kg)   Constitutional:  normal weight, in NAD Eyes: PERRLA, EOMI, no exophthalmos ENT: moist mucous membranes, no thyromegaly, no cervical lymphadenopathy Cardiovascular: RRR, No MRG Respiratory: CTA B Gastrointestinal: abdomen soft, NT, ND, BS+ Musculoskeletal: no deformities, strength intact in all 4 Skin: moist, warm, no rashes Neurological: no tremor with outstretched hands, DTR normal in all 4  ASSESSMENT: 1. Hypothyroidism  PLAN:  1. Patient with mild hypothyroidism, on low-dose levothyroxine -At last visit, she gained weight, which she attributed to eating more due to breast-feeding.  She also had significant fatigue due to still breast-feeding her daughter throughout the night even though she was 51 months old.  She was working on decreasing and stopping breast-feeding. She did stop since then she is sleeping a little better.  - latest thyroid labs reviewed with pt >> normal: Lab Results  Component Value Date   TSH 2.07 09/17/2019   - she continues on LT4 50 mcg daily - pt feels good on this dose, with no complaints. - we discussed about taking the thyroid hormone every day, with water, >30 minutes before breakfast, separated by >4 hours from acid reflux medications, calcium, iron, multivitamins. Pt. is taking it correctly. - will check thyroid tests today: TSH and fT4 - If labs are abnormal, she will need to return for repeat TFTs in 1.5 months - OTW, I will see her back in a  year  Component     Latest Ref Rng & Units 09/22/2020  T4,Free(Direct)     0.60 - 1.60 ng/dL 4.03  TSH     4.74 - 2.59 uIU/mL 2.00  Normal TFTs.  Carlus Pavlov, MD PhD [facility] Endocrinology

## 2020-09-22 NOTE — Patient Instructions (Signed)
Please continue Levothyroxine 50 mcg daily.  Take the thyroid hormone every day, with water, at least 30 minutes before breakfast, separated by at least 4 hours from: - acid reflux medications - calcium - iron - multivitamins  Please stop at the lab.  Please come back for a follow-up appointment in 1 year. 

## 2020-10-01 DIAGNOSIS — H5213 Myopia, bilateral: Secondary | ICD-10-CM | POA: Diagnosis not present

## 2020-10-22 ENCOUNTER — Ambulatory Visit (INDEPENDENT_AMBULATORY_CARE_PROVIDER_SITE_OTHER): Payer: Federal, State, Local not specified - PPO | Admitting: Family Medicine

## 2020-10-22 ENCOUNTER — Other Ambulatory Visit: Payer: Self-pay

## 2020-10-22 ENCOUNTER — Encounter: Payer: Self-pay | Admitting: Family Medicine

## 2020-10-22 VITALS — BP 106/64 | HR 72 | Temp 97.9°F | Resp 14 | Ht 59.0 in | Wt 129.0 lb

## 2020-10-22 DIAGNOSIS — M659 Synovitis and tenosynovitis, unspecified: Secondary | ICD-10-CM | POA: Diagnosis not present

## 2020-10-22 DIAGNOSIS — G8929 Other chronic pain: Secondary | ICD-10-CM

## 2020-10-22 DIAGNOSIS — Z0000 Encounter for general adult medical examination without abnormal findings: Secondary | ICD-10-CM

## 2020-10-22 DIAGNOSIS — M25562 Pain in left knee: Secondary | ICD-10-CM | POA: Diagnosis not present

## 2020-10-22 DIAGNOSIS — E559 Vitamin D deficiency, unspecified: Secondary | ICD-10-CM | POA: Diagnosis not present

## 2020-10-22 DIAGNOSIS — Z0001 Encounter for general adult medical examination with abnormal findings: Secondary | ICD-10-CM | POA: Diagnosis not present

## 2020-10-22 MED ORDER — MELOXICAM 7.5 MG PO TABS: 7.5000 mg | ORAL_TABLET | Freq: Every day | ORAL | 0 refills | Status: DC

## 2020-10-22 NOTE — Patient Instructions (Addendum)
F/U 1 year for physical Meloxicam once a day  Referral to orthopedics

## 2020-10-22 NOTE — Progress Notes (Signed)
   Subjective:    Patient ID: Ann Rogers, female    DOB: Nov 12, 1976, 44 y.o.   MRN: [id number]  Patient presents for Annual Exam (is fasting)   P there for CPE, meds reviewed  Left knee pain- she fell down the stairs 1 year ago,she hit knee butno specific injury at the time, it stayed sore but eventually improved. But she notices randomly she will have pain with certain acivites. Example-when she gets out of car- sharp pain runs through it , no swelling, no locking  sje jas mpt    Left thumb pain for the pain -she had tenosynvitis, ashe did have 1 shot in thumb by ortho  She is right hand dominant   No exercise   Still nursing    PAP Smear UTD physicians for owmen    Immunization- COVID-19/TDAP/ flu UTD  Dentist UTD  Hypothyroidism- taking levothyroxine     Review Of Systems:  GEN- denies fatigue, fever, weight loss,weakness, recent illness HEENT- denies eye drainage, change in vision, nasal discharge, CVS- denies chest pain, palpitations RESP- denies SOB, cough, wheeze ABD- denies N/V, change in stools, abd pain GU- denies dysuria, hematuria, dribbling, incontinence MSK- + joint pain, muscle aches, injury Neuro- denies headache, dizziness, syncope, seizure activity       Objective:    BP 106/64   Pulse 72   Temp 97.9 F (36.6 C) (Temporal)   Resp 14   Ht 4\' 11"  (1.499 m)   Wt 129 lb (58.5 kg)   SpO2 99%   BMI 26.05 kg/m  GEN- NAD, alert and oriented x3 HEENT- PERRL, EOMI, non injected sclera, pink conjunctiva, MMM, oropharynx clear Neck- Supple, no thyromegaly CVS- RRR, no murmur RESP-CTAB ABD-NABS,soft,NT,ND MSK- Bilat knee no effsuion, FROM, mild crepitus left knee ligaments grossly  In tact, left wrist good ROM, +finklesteins left , ,ild TTP base of left thumb, no swelling of hand  Psych normal affect and mood  EXT- No edema Pulses- Radial, DP- 2+        Assessment & Plan:      Problem List Items Addressed This Visit    None     Visit Diagnoses    Routine general medical examination at a health care facility    -  Primary   cpe done. immunizations, gym utd. fasting labs done, increase physical   Relevant Orders   VITAMIN D 25 Hydroxy (Vit-D Deficiency, Fractures) (Completed)   CBC with Differential/Platelet (Completed)   Comprehensive metabolic panel (Completed)   Lipid panel (Completed)   Vitamin D deficiency       Chronic pain of left knee       referral to ortho,h/o fall   Relevant Medications   meloxicam (MOBIC) 7.5 MG tablet   Other Relevant Orders   Ambulatory referral to Orthopedic Surgery   Tenosynovitis of finger       referral to ortho, given mobic for inflammation   Relevant Orders   Ambulatory referral to Orthopedic Surgery      Note: This dictation was prepared with Dragon dictation along with smaller phrase technology. Any transcriptional errors that result from this process are unintentional.

## 2020-10-23 ENCOUNTER — Encounter: Payer: Self-pay | Admitting: Family Medicine

## 2020-10-23 LAB — COMPREHENSIVE METABOLIC PANEL
AG Ratio: 1.5 (calc) (ref 1.0–2.5)
ALT: 16 U/L (ref 6–29)
AST: 18 U/L (ref 10–30)
Albumin: 3.9 g/dL (ref 3.6–5.1)
Alkaline phosphatase (APISO): 43 U/L (ref 31–125)
BUN: 10 mg/dL (ref 7–25)
CO2: 27 mmol/L (ref 20–32)
Calcium: 8.8 mg/dL (ref 8.6–10.2)
Chloride: 105 mmol/L (ref 98–110)
Creat: 0.83 mg/dL (ref 0.50–1.10)
Globulin: 2.6 g/dL (calc) (ref 1.9–3.7)
Glucose, Bld: 89 mg/dL (ref 65–99)
Potassium: 4.3 mmol/L (ref 3.5–5.3)
Sodium: 140 mmol/L (ref 135–146)
Total Bilirubin: 0.4 mg/dL (ref 0.2–1.2)
Total Protein: 6.5 g/dL (ref 6.1–8.1)

## 2020-10-23 LAB — CBC WITH DIFFERENTIAL/PLATELET
Absolute Monocytes: 415 cells/uL (ref 200–950)
Basophils Absolute: 50 cells/uL (ref 0–200)
Basophils Relative: 0.8 %
Eosinophils Absolute: 161 cells/uL (ref 15–500)
Eosinophils Relative: 2.6 %
HCT: 38.1 % (ref 35.0–45.0)
Hemoglobin: 12.4 g/dL (ref 11.7–15.5)
Lymphs Abs: 2139 cells/uL (ref 850–3900)
MCH: 28.8 pg (ref 27.0–33.0)
MCHC: 32.5 g/dL (ref 32.0–36.0)
MCV: 88.6 fL (ref 80.0–100.0)
MPV: 10.4 fL (ref 7.5–12.5)
Monocytes Relative: 6.7 %
Neutro Abs: 3435 cells/uL (ref 1500–7800)
Neutrophils Relative %: 55.4 %
Platelets: 277 10*3/uL (ref 140–400)
RBC: 4.3 10*6/uL (ref 3.80–5.10)
RDW: 11.8 % (ref 11.0–15.0)
Total Lymphocyte: 34.5 %
WBC: 6.2 10*3/uL (ref 3.8–10.8)

## 2020-10-23 LAB — LIPID PANEL
Cholesterol: 179 mg/dL (ref <200)
HDL: 59 mg/dL (ref >=50)
LDL Cholesterol (Calc): 106 mg/dL (calc) — ABNORMAL HIGH
Non-HDL Cholesterol (Calc): 120 mg/dL (calc) (ref <130)
Total CHOL/HDL Ratio: 3 (calc) (ref <5.0)
Triglycerides: 46 mg/dL (ref <150)

## 2020-10-23 LAB — VITAMIN D 25 HYDROXY (VIT D DEFICIENCY, FRACTURES): Vit D, 25-Hydroxy: 70 ng/mL (ref 30–100)

## 2020-10-28 ENCOUNTER — Encounter: Payer: Self-pay | Admitting: *Deleted

## 2020-12-18 DIAGNOSIS — M654 Radial styloid tenosynovitis [de Quervain]: Secondary | ICD-10-CM | POA: Diagnosis not present

## 2020-12-29 DIAGNOSIS — S8391XA Sprain of unspecified site of right knee, initial encounter: Secondary | ICD-10-CM | POA: Diagnosis not present

## 2020-12-29 DIAGNOSIS — S8001XA Contusion of right knee, initial encounter: Secondary | ICD-10-CM | POA: Diagnosis not present

## 2021-01-02 DIAGNOSIS — S8391XD Sprain of unspecified site of right knee, subsequent encounter: Secondary | ICD-10-CM | POA: Diagnosis not present

## 2021-01-12 DIAGNOSIS — H04123 Dry eye syndrome of bilateral lacrimal glands: Secondary | ICD-10-CM | POA: Diagnosis not present

## 2021-01-12 DIAGNOSIS — H5213 Myopia, bilateral: Secondary | ICD-10-CM | POA: Diagnosis not present

## 2021-01-13 DIAGNOSIS — S83241A Other tear of medial meniscus, current injury, right knee, initial encounter: Secondary | ICD-10-CM | POA: Diagnosis not present

## 2021-01-14 DIAGNOSIS — H5213 Myopia, bilateral: Secondary | ICD-10-CM | POA: Diagnosis not present

## 2021-01-14 DIAGNOSIS — H04123 Dry eye syndrome of bilateral lacrimal glands: Secondary | ICD-10-CM | POA: Diagnosis not present

## 2021-01-17 DIAGNOSIS — M25561 Pain in right knee: Secondary | ICD-10-CM | POA: Diagnosis not present

## 2021-01-22 DIAGNOSIS — M654 Radial styloid tenosynovitis [de Quervain]: Secondary | ICD-10-CM | POA: Diagnosis not present

## 2021-01-22 DIAGNOSIS — M79645 Pain in left finger(s): Secondary | ICD-10-CM | POA: Diagnosis not present

## 2021-01-29 DIAGNOSIS — M25661 Stiffness of right knee, not elsewhere classified: Secondary | ICD-10-CM | POA: Diagnosis not present

## 2021-01-29 DIAGNOSIS — M25561 Pain in right knee: Secondary | ICD-10-CM | POA: Diagnosis not present

## 2021-01-29 DIAGNOSIS — R262 Difficulty in walking, not elsewhere classified: Secondary | ICD-10-CM | POA: Diagnosis not present

## 2021-01-29 DIAGNOSIS — M6281 Muscle weakness (generalized): Secondary | ICD-10-CM | POA: Diagnosis not present

## 2021-02-05 DIAGNOSIS — M25661 Stiffness of right knee, not elsewhere classified: Secondary | ICD-10-CM | POA: Diagnosis not present

## 2021-02-05 DIAGNOSIS — M6281 Muscle weakness (generalized): Secondary | ICD-10-CM | POA: Diagnosis not present

## 2021-02-05 DIAGNOSIS — M25561 Pain in right knee: Secondary | ICD-10-CM | POA: Diagnosis not present

## 2021-02-05 DIAGNOSIS — R262 Difficulty in walking, not elsewhere classified: Secondary | ICD-10-CM | POA: Diagnosis not present

## 2021-02-25 DIAGNOSIS — R0981 Nasal congestion: Secondary | ICD-10-CM | POA: Diagnosis not present

## 2021-02-25 DIAGNOSIS — B9689 Other specified bacterial agents as the cause of diseases classified elsewhere: Secondary | ICD-10-CM | POA: Diagnosis not present

## 2021-02-25 DIAGNOSIS — J019 Acute sinusitis, unspecified: Secondary | ICD-10-CM | POA: Diagnosis not present

## 2021-02-25 DIAGNOSIS — R059 Cough, unspecified: Secondary | ICD-10-CM | POA: Diagnosis not present

## 2021-07-19 ENCOUNTER — Other Ambulatory Visit: Payer: Self-pay | Admitting: Internal Medicine

## 2021-09-22 ENCOUNTER — Ambulatory Visit: Payer: Federal, State, Local not specified - PPO | Admitting: Internal Medicine

## 2021-10-02 DIAGNOSIS — L700 Acne vulgaris: Secondary | ICD-10-CM | POA: Diagnosis not present

## 2021-11-06 ENCOUNTER — Ambulatory Visit: Payer: Federal, State, Local not specified - PPO | Admitting: Internal Medicine

## 2021-11-06 ENCOUNTER — Other Ambulatory Visit: Payer: Self-pay

## 2021-11-06 ENCOUNTER — Encounter: Payer: Self-pay | Admitting: Internal Medicine

## 2021-11-06 VITALS — BP 120/82 | Ht 59.0 in | Wt 127.0 lb

## 2021-11-06 DIAGNOSIS — E039 Hypothyroidism, unspecified: Secondary | ICD-10-CM

## 2021-11-06 LAB — T4, FREE: Free T4: 1.13 ng/dL (ref 0.60–1.60)

## 2021-11-06 LAB — TSH: TSH: 1.5 u[IU]/mL (ref 0.35–5.50)

## 2021-11-06 MED ORDER — LEVOTHYROXINE SODIUM 50 MCG PO TABS
50.0000 ug | ORAL_TABLET | Freq: Every day | ORAL | 3 refills | Status: DC
Start: 2021-11-06 — End: 2022-11-12

## 2021-11-06 NOTE — Patient Instructions (Signed)
Please continue Levothyroxine 50 mcg daily.  Take the thyroid hormone every day, with water, at least 30 minutes before breakfast, separated by at least 4 hours from: - acid reflux medications - calcium - iron - multivitamins  Please stop at the lab.  Please come back for a follow-up appointment in 1 year. 

## 2021-11-06 NOTE — Progress Notes (Signed)
Patient ID: Ann Rogers, female   DOB: June 12, 1976, 45 y.o.   MRN: [id number]  This visit occurred during the SARS-CoV-2 public health emergency.  Safety protocols were in place, including screening questions prior to the visit, additional usage of staff PPE, and extensive cleaning of exam room while observing appropriate contact time as indicated for disinfecting solutions.   HPI  Ann Rogers is a 45 y.o.-year-old female, returning for f/u for hypothyroidism, dx 07/2013. Last visit 1 year ago.  Interim history: At last visit, she lost weight on Optavia diet-stopped 06/2020. No significant weight changes since last OV. She stopped b'feeding in 06/2021. She has some fatigue. She is still commuting to work - 1h 20 min.   Reviewed history: Pt. has been dx with hypothyroidism in 07/2013 (TSH high, but pt not told how high, and records not available) >> started on Levothyroxine 25 mcg.  During pregnancy, with increased the dose of her levothyroxine to 75 mcg daily and we decreased it back after the pregnancy to 50 mcg daily.  Pt is on levothyroxine 50 mcg daily, taken: - in am - fasting - at least 30 min from b'fast - no Ca, Fe, PPIs - + MVIs at night - not on Biotin  TFTs were normal: Lab Results  Component Value Date   TSH 2.00 09/22/2020   TSH 2.07 09/17/2019   TSH 2.08 09/20/2018   TSH 1.71 04/13/2018   TSH 2.06 10/24/2017   FREET4 1.13 09/22/2020   FREET4 0.98 09/17/2019   FREET4 1.07 09/20/2018   FREET4 0.99 04/13/2018   FREET4 0.94 10/24/2017   Her thyroid antibodies were not elevated:  Thyroid Peroxidase Antibody 10/19/2013 10.6  <35.0 IU/mL Final   Pt denies: - feeling nodules in neck - hoarseness - dysphagia - choking - SOB with lying down  No FH of thyroid disease or thyroid cancer. No h/o radiation tx to head or neck.  No herbal supplements. No Biotin use. No recent steroids use.   She has a h/o vitamin D deficiency - not taking this daily.  She stopped her prenatal vitamins.  She is trying to establish care with another PCP.  ROS: + see HPI  I reviewed pt's medications, allergies, PMH, social hx, family hx, and changes were documented in the history of present illness. Otherwise, unchanged from my initial visit note.  Past Medical History:  Diagnosis Date   AMA (advanced maternal age) primigravida 35+    Hypothyroidism    IBS (irritable bowel syndrome)    Slow transit constipation 06/06/2014   She has MiraLax it sounds like, she responds to intermittent Dulcolax, Linzess 290 mcg not helpful    Past Surgical History:  Procedure Laterality Date   ANAL RECTAL MANOMETRY N/A 07/01/2014   Procedure: ANAL RECTAL MANOMETRY;  Surgeon: Iva Boop, MD;  Location: [facility];  Service: Endoscopy;  Laterality: N/A;   egg preservation     LASIK  2013   Social History   Socioeconomic History   Marital status: Widowed    Spouse name: Not on file   Number of children: 0   Years of education: Not on file   Highest education level: Not on file  Occupational History   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[employer]  Tobacco Use   Smoking status: Never   Smokeless tobacco: Never  Vaping Use   Vaping Use: Never used  Substance and Sexual Activity   Alcohol use: No  Drug use: No   Sexual activity: Not Currently  Other Topics Concern   Not on file  Social History Narrative   She is single, no children lives in [place]    [employer]   Regular exercise: no   Caffeine use: coffee in the am; 3-4 sodas daily   Last updated 06/06/2014            Social Determinants of Health   Financial Resource Strain: Not on file  Food Insecurity: Not on file  Transportation Needs: Not on file  Physical Activity: Not on file  Stress: Not on file  Social Connections: Not on file  Intimate Partner Violence: Not on file   Current Outpatient  Medications on File Prior to Visit  Medication Sig Dispense Refill   Cholecalciferol (VITAMIN D) 50 MCG (2000 UT) CAPS Take 1 capsule (2,000 Units total) by mouth daily. 30 capsule 11   levothyroxine (SYNTHROID) 50 MCG tablet TAKE 1 TABLET BY MOUTH EVERY DAY 90 tablet 0   meloxicam (MOBIC) 7.5 MG tablet Take 1 tablet (7.5 mg total) by mouth daily. 30 tablet 0   Prenatal Vit-Fe Fumarate-FA (PRENATAL VITAMIN) 27-0.8 MG TABS Take 1 tablet by mouth daily. 30 tablet 0   No current facility-administered medications on file prior to visit.   No Known Allergies Family History  Problem Relation Age of Onset   Stroke Mother    Hypertension Father    Hyperlipidemia Father    Cancer Maternal Grandfather    Hypertension Maternal Aunt    Hyperlipidemia Maternal Aunt    Miscarriages / [place] Cousin    Colon cancer Neg Hx    Colon polyps Neg Hx    Esophageal cancer Neg Hx    Kidney disease Neg Hx    PE: BP 120/82 (BP Location: Right Arm, Patient Position: Sitting, Cuff Size: Normal)   Ht 4\' 11"  (1.499 m)   Wt 127 lb (57.6 kg)   BMI 25.65 kg/m  Body mass index is 25.65 kg/m. Wt Readings from Last 3 Encounters:  11/06/21 127 lb (57.6 kg)  10/22/20 129 lb (58.5 kg)  09/22/20 128 lb 3.2 oz (58.2 kg)   Constitutional: normal weight, in NAD Eyes: PERRLA, EOMI, no exophthalmos ENT: moist mucous membranes, no thyromegaly, no cervical lymphadenopathy Cardiovascular: RRR, No MRG Respiratory: CTA B Musculoskeletal: no deformities, strength intact in all 4 Skin: moist, warm, no rashes Neurological: no tremor with outstretched hands, DTR normal in all 4  ASSESSMENT: 1. Hypothyroidism  PLAN:  1. Patient with mild hypothyroidism, on low-dose levothyroxine - latest thyroid labs reviewed with pt. >> normal: Lab Results  Component Value Date   TSH 2.00 09/22/2020  - she continues on LT4 50 mcg daily - pt feels good on this dose, without complaints.  She does have some fatigue, which he  attributes to commuting.  Daughter also not sleeping very well through the night - we discussed about taking the thyroid hormone every day, with water, >30 minutes before breakfast, separated by >4 hours from acid reflux medications, calcium, iron, multivitamins. Pt. is taking it correctly. - will check thyroid tests today: TSH and fT4 - If labs are abnormal, she will need to return for repeat TFTs in 1.5 months - OTW, I will see her back in a year   She needs refills.  Component     Latest Ref Rng & Units 11/06/2021  TSH     0.35 - 5.50 uIU/mL 1.50  T4,Free(Direct)  0.60 - 1.60 ng/dL 6.97  Excellent TFTs.  Carlus Pavlov, MD PhD [facility] Endocrinology

## 2021-11-12 DIAGNOSIS — Z131 Encounter for screening for diabetes mellitus: Secondary | ICD-10-CM | POA: Diagnosis not present

## 2021-11-12 DIAGNOSIS — Z01419 Encounter for gynecological examination (general) (routine) without abnormal findings: Secondary | ICD-10-CM | POA: Diagnosis not present

## 2021-11-12 DIAGNOSIS — Z6825 Body mass index (BMI) 25.0-25.9, adult: Secondary | ICD-10-CM | POA: Diagnosis not present

## 2021-11-12 DIAGNOSIS — G4700 Insomnia, unspecified: Secondary | ICD-10-CM | POA: Diagnosis not present

## 2021-11-12 LAB — HM PAP SMEAR: HM Pap smear: NORMAL

## 2022-01-01 DIAGNOSIS — Z1231 Encounter for screening mammogram for malignant neoplasm of breast: Secondary | ICD-10-CM | POA: Diagnosis not present

## 2022-01-05 ENCOUNTER — Other Ambulatory Visit: Payer: Self-pay | Admitting: Obstetrics and Gynecology

## 2022-01-05 DIAGNOSIS — R928 Other abnormal and inconclusive findings on diagnostic imaging of breast: Secondary | ICD-10-CM

## 2022-01-12 ENCOUNTER — Other Ambulatory Visit: Payer: Self-pay | Admitting: Obstetrics and Gynecology

## 2022-01-12 ENCOUNTER — Other Ambulatory Visit: Payer: Self-pay

## 2022-01-12 ENCOUNTER — Ambulatory Visit
Admission: RE | Admit: 2022-01-12 | Discharge: 2022-01-12 | Disposition: A | Discharge status: Home / Self Care | Payer: Federal, State, Local not specified - PPO | Source: Ambulatory Visit | Attending: Obstetrics and Gynecology | Admitting: Obstetrics and Gynecology

## 2022-01-12 DIAGNOSIS — R928 Other abnormal and inconclusive findings on diagnostic imaging of breast: Secondary | ICD-10-CM

## 2022-01-12 DIAGNOSIS — N6489 Other specified disorders of breast: Secondary | ICD-10-CM

## 2022-01-12 DIAGNOSIS — R922 Inconclusive mammogram: Secondary | ICD-10-CM | POA: Diagnosis not present

## 2022-01-12 LAB — HM MAMMOGRAPHY

## 2022-01-19 ENCOUNTER — Encounter: Payer: Self-pay | Admitting: Family Medicine

## 2022-01-19 ENCOUNTER — Other Ambulatory Visit: Payer: Self-pay

## 2022-01-19 ENCOUNTER — Ambulatory Visit (INDEPENDENT_AMBULATORY_CARE_PROVIDER_SITE_OTHER): Payer: Federal, State, Local not specified - PPO | Admitting: Family Medicine

## 2022-01-19 VITALS — BP 110/64 | HR 64 | Ht 59.0 in | Wt 127.0 lb

## 2022-01-19 DIAGNOSIS — R748 Abnormal levels of other serum enzymes: Secondary | ICD-10-CM | POA: Diagnosis not present

## 2022-01-19 DIAGNOSIS — Z7689 Persons encountering health services in other specified circumstances: Secondary | ICD-10-CM

## 2022-01-19 DIAGNOSIS — E039 Hypothyroidism, unspecified: Secondary | ICD-10-CM

## 2022-01-19 NOTE — Progress Notes (Signed)
Date:  01/19/2022   Name:  Ann Rogers   DOB:  Aug 04, 1976   MRN:  [id number]   Chief Complaint: Establish Care (Pcp went somewhere else)  Patient is a 46 year old female who presents for a establish care exam. The patient reports the following problems: none. Health maintenance has been reviewed up to date.     Lab Results  Component Value Date   NA 140 10/22/2020   K 4.3 10/22/2020   CO2 27 10/22/2020   GLUCOSE 89 10/22/2020   BUN 10 10/22/2020   CREATININE 0.83 10/22/2020   CALCIUM 8.8 10/22/2020   GFRNONAA >89 04/15/2016   Lab Results  Component Value Date   CHOL 179 10/22/2020   HDL 59 10/22/2020   LDLCALC 106 (H) 10/22/2020   TRIG 46 10/22/2020   CHOLHDL 3.0 10/22/2020   Lab Results  Component Value Date   TSH 1.50 11/06/2021   No results found for: HGBA1C Lab Results  Component Value Date   WBC 6.2 10/22/2020   HGB 12.4 10/22/2020   HCT 38.1 10/22/2020   MCV 88.6 10/22/2020   PLT 277 10/22/2020   Lab Results  Component Value Date   ALT 16 10/22/2020   AST 18 10/22/2020   ALKPHOS 81 07/05/2017   BILITOT 0.4 10/22/2020   Lab Results  Component Value Date   VD25OH 70 10/22/2020     Review of Systems  Constitutional:  Negative for chills and fever.  HENT:  Negative for drooling, ear discharge, ear pain and sore throat.   Respiratory:  Negative for cough, shortness of breath and wheezing.   Cardiovascular:  Negative for chest pain, palpitations and leg swelling.  Gastrointestinal:  Negative for abdominal pain, blood in stool, constipation, diarrhea and nausea.  Endocrine: Negative for polydipsia.  Genitourinary:  Negative for dysuria, frequency, hematuria and urgency.  Musculoskeletal:  Negative for back pain, myalgias and neck pain.  Skin:  Negative for rash.  Allergic/Immunologic: Negative for environmental allergies.  Neurological:  Negative for dizziness and headaches.  Hematological:  Does not bruise/bleed easily.   Psychiatric/Behavioral:  Negative for suicidal ideas. The patient is not nervous/anxious.    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  Current Meds  Medication Sig   Cholecalciferol (VITAMIN D) 50 MCG (2000 UT) CAPS Take 1 capsule (2,000 Units total) by mouth daily.   levothyroxine (SYNTHROID) 50 MCG tablet Take 1 tablet (50 mcg total) by mouth daily.    PHQ 2/9 Scores 01/19/2022 10/22/2020 10/22/2019 10/16/2018  PHQ - 2 Score 0 0 0 0  PHQ- 9 Score 0 0 - -    GAD 7 : Generalized Anxiety Score 01/19/2022  Nervous, Anxious, on Edge 0  Control/stop worrying 0  Worry too much - different things 0  Trouble relaxing 0  Restless 0  Easily annoyed or irritable 0  Afraid - awful might happen 0  Total GAD 7 Score 0  Anxiety Difficulty Not difficult at all    BP Readings from Last 3 Encounters:  01/19/22 110/64  11/06/21 120/82  10/22/20 106/64    Physical Exam Vitals and nursing note reviewed.  Constitutional:      Appearance: She is well-developed.  HENT:     Head: Normocephalic.     Right Ear: Tympanic membrane, ear canal and external ear normal. There is no impacted cerumen.     Left Ear: Tympanic membrane, ear canal and external ear normal. There is no impacted cerumen.     Nose: Nose normal. No congestion or  rhinorrhea.  Eyes:     General: Lids are everted, no foreign bodies appreciated. No scleral icterus.       Left eye: No foreign body or hordeolum.     Conjunctiva/sclera: Conjunctivae normal.     Right eye: Right conjunctiva is not injected.     Left eye: Left conjunctiva is not injected.     Pupils: Pupils are equal, round, and reactive to light.  Neck:     Thyroid: No thyromegaly.     Vascular: No JVD.     Trachea: No tracheal deviation.  Cardiovascular:     Rate and Rhythm: Normal rate and regular rhythm.     Heart sounds: Normal heart sounds, S1 normal and S2 normal. No murmur heard. No systolic murmur is present.  No diastolic murmur is present.    No friction rub. No gallop. No S3 or S4 sounds.  Pulmonary:     Effort: Pulmonary effort is normal. No respiratory distress.     Breath sounds: Normal breath sounds. No decreased breath sounds, wheezing, rhonchi or rales.  Abdominal:     General: Bowel sounds are normal.     Palpations: Abdomen is soft. There is no mass.     Tenderness: There is no abdominal tenderness. There is no guarding or rebound.  Musculoskeletal:        General: No tenderness. Normal range of motion.     Cervical back: Normal range of motion and neck supple.  Lymphadenopathy:     Cervical: No cervical adenopathy.  Skin:    General: Skin is warm.     Findings: No rash.  Neurological:     Mental Status: She is alert and oriented to person, place, and time.     Cranial Nerves: No cranial nerve deficit.     Deep Tendon Reflexes: Reflexes normal.  Psychiatric:        Mood and Affect: Mood is not anxious or depressed.    Wt Readings from Last 3 Encounters:  01/19/22 127 lb (57.6 kg)  11/06/21 127 lb (57.6 kg)  10/22/20 129 lb (58.5 kg)    BP 110/64    Pulse 64    Ht 4\' 11"  (1.499 m)    Wt 127 lb (57.6 kg)    LMP 01/19/2022 (Exact Date)    BMI 25.65 kg/m   Assessment and Plan:  1. Establishing care with new doctor, encounter for Patient  establishing care with new physician.  Patient is followed for hypothyroidism by endocrinology.  She does have a history of elevated transaminases and that will be checked with lab work at the time of her physical exam which is to be scheduled in the next couple months.  At that  time we will also check lipids and fasting glucose.

## 2022-01-25 ENCOUNTER — Other Ambulatory Visit: Payer: Federal, State, Local not specified - PPO

## 2022-02-19 ENCOUNTER — Ambulatory Visit (INDEPENDENT_AMBULATORY_CARE_PROVIDER_SITE_OTHER): Payer: Federal, State, Local not specified - PPO | Admitting: Family Medicine

## 2022-02-19 ENCOUNTER — Encounter: Payer: Self-pay | Admitting: Family Medicine

## 2022-02-19 VITALS — BP 120/70 | HR 80 | Ht 59.0 in | Wt 129.0 lb

## 2022-02-19 DIAGNOSIS — R5383 Other fatigue: Secondary | ICD-10-CM

## 2022-02-19 DIAGNOSIS — Z0000 Encounter for general adult medical examination without abnormal findings: Secondary | ICD-10-CM | POA: Diagnosis not present

## 2022-02-19 DIAGNOSIS — E559 Vitamin D deficiency, unspecified: Secondary | ICD-10-CM

## 2022-02-19 DIAGNOSIS — R748 Abnormal levels of other serum enzymes: Secondary | ICD-10-CM | POA: Diagnosis not present

## 2022-02-19 NOTE — Progress Notes (Signed)
? ? ?Date:  02/19/2022  ? ?Name:  Ann Rogers   DOB:  September 14, 1976   MRN:  [id number] ? ? ?Chief Complaint: No chief complaint on file. ? ?Patient is a 46 year old female who presents for a comprehensive physical exam. The patient reports the following problems: history of vitamin D deficiency. Health maintenance has been reviewed up-to-date. ?  ? ? ?Lab Results  ?Component Value Date  ? NA 140 10/22/2020  ? K 4.3 10/22/2020  ? CO2 27 10/22/2020  ? GLUCOSE 89 10/22/2020  ? BUN 10 10/22/2020  ? CREATININE 0.83 10/22/2020  ? CALCIUM 8.8 10/22/2020  ? GFRNONAA >89 04/15/2016  ? ?Lab Results  ?Component Value Date  ? CHOL 179 10/22/2020  ? HDL 59 10/22/2020  ? LDLCALC 106 (H) 10/22/2020  ? TRIG 46 10/22/2020  ? CHOLHDL 3.0 10/22/2020  ? ?Lab Results  ?Component Value Date  ? TSH 1.50 11/06/2021  ? ?No results found for: HGBA1C ?Lab Results  ?Component Value Date  ? WBC 6.2 10/22/2020  ? HGB 12.4 10/22/2020  ? HCT 38.1 10/22/2020  ? MCV 88.6 10/22/2020  ? PLT 277 10/22/2020  ? ?Lab Results  ?Component Value Date  ? ALT 16 10/22/2020  ? AST 18 10/22/2020  ? ALKPHOS 81 07/05/2017  ? BILITOT 0.4 10/22/2020  ? ?Lab Results  ?Component Value Date  ? VD25OH 70 10/22/2020  ?  ? ?Review of Systems  ?Constitutional:  Negative for chills and fever.  ?HENT:  Negative for drooling, ear discharge, ear pain and sore throat.   ?Respiratory:  Negative for cough, shortness of breath and wheezing.   ?Cardiovascular:  Negative for chest pain, palpitations and leg swelling.  ?Gastrointestinal:  Negative for abdominal pain, blood in stool, constipation, diarrhea and nausea.  ?Endocrine: Negative for polydipsia and polyuria.  ?Genitourinary:  Negative for dysuria, frequency, hematuria and urgency.  ?Musculoskeletal:  Negative for back pain, myalgias and neck pain.  ?Skin:  Negative for rash.  ?Allergic/Immunologic: Negative for environmental allergies.  ?Neurological:  Negative for dizziness and headaches.  ?Hematological:  Does not  bruise/bleed easily.  ?Psychiatric/Behavioral:  Negative for suicidal ideas. The patient is not nervous/anxious.   ? ?Patient Active Problem List  ? Diagnosis Date Noted  ? Abnormal transaminases - resolved 01/22/2016  ? Slow transit constipation 06/06/2014  ? 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 Hypothyroidism 10/19/2013  ? ? ?No Known Allergies ? ?Past Surgical History:  ?Procedure Laterality Date  ? ANAL RECTAL MANOMETRY N/A 07/01/2014  ? Procedure: ANAL RECTAL MANOMETRY;  Surgeon: Iva Boop, MD;  Location: [facility];  Service: Endoscopy;  Laterality: N/A;  ? egg preservation    ? LASIK  2013  ? ? ?Social History  ? ?Tobacco Use  ? Smoking status: Never  ? Smokeless tobacco: Never  ?Vaping Use  ? Vaping Use: Never used  ?Substance Use Topics  ? Alcohol use: No  ? Drug use: No  ? ? ? ?Medication list has been reviewed and updated. ? ?No outpatient medications have been marked as taking for the 02/19/22 encounter (Office Visit) with Duanne Limerick, MD.  ? ? ? ?  02/19/2022  ?  9:55 AM 01/19/2022  ?  2:08 PM 10/22/2020  ?  9:24 AM 10/22/2019  ?  8:38 AM  ?PHQ 2/9 Scores  ?PHQ - 2 Score 0 0 0 0  ?PHQ- 9 Score 0 0 0   ? ? ? ?  02/19/2022  ?  9:55 AM 01/19/2022  ?  2:08 PM  ?GAD 7 : Generalized Anxiety Score  ?Nervous, Anxious, on Edge 0 0  ?Control/stop worrying 0 0  ?Worry too much - different things 0 0  ?Trouble relaxing 0 0  ?Restless 0 0  ?Easily annoyed or irritable 0 0  ?Afraid - awful might happen 0 0  ?Total GAD 7 Score 0 0  ?Anxiety Difficulty Not difficult at all Not difficult at all  ? ? ?BP Readings from Last 3 Encounters:  ?02/19/22 120/70  ?01/19/22 110/64  ?11/06/21 120/82  ? ? ?Physical Exam ?Vitals and nursing note reviewed. Exam conducted with a chaperone present.  ?Constitutional:   ?   General: She is not in acute distress. ?   Appearance: Normal appearance. She is well-groomed. She is not diaphoretic.  ?HENT:  ?   Head: Normocephalic and atraumatic.  ?   Jaw: There is normal jaw occlusion.  ?   Right  Ear: Hearing, tympanic membrane, ear canal and external ear normal.  ?   Left Ear: Hearing, tympanic membrane, ear canal and external ear normal.  ?   Nose: Nose normal.  ?   Mouth/Throat:  ?   Lips: Pink.  ?   Mouth: Mucous membranes are moist. Mucous membranes are pale.  ?   Tongue: No lesions.  ?   Palate: No mass.  ?   Pharynx: Oropharynx is clear. Uvula midline.  ?   Tonsils: No tonsillar exudate.  ?Eyes:  ?   General: Lids are normal. Vision grossly intact. Gaze aligned appropriately.     ?   Right eye: No discharge.     ?   Left eye: No discharge.  ?   Extraocular Movements: Extraocular movements intact.  ?   Conjunctiva/sclera: Conjunctivae normal.  ?   Pupils: Pupils are equal, round, and reactive to light.  ?Neck:  ?   Thyroid: No thyroid mass, thyromegaly or thyroid tenderness.  ?   Vascular: No JVD.  ?Cardiovascular:  ?   Rate and Rhythm: Normal rate and regular rhythm.  ?   Chest Wall: PMI is not displaced.  ?   Pulses: Normal pulses.  ?   Heart sounds: Normal heart sounds, S1 normal and S2 normal. No murmur heard. ?No systolic murmur is present.  ?No diastolic murmur is present.  ?  No friction rub. No gallop. No S3 or S4 sounds.  ?Pulmonary:  ?   Effort: Pulmonary effort is normal.  ?   Breath sounds: Normal breath sounds. No decreased breath sounds, wheezing, rhonchi or rales.  ?Chest:  ?   Comments: Breast deferred ?Abdominal:  ?   General: Bowel sounds are normal.  ?   Palpations: Abdomen is soft. There is no hepatomegaly, splenomegaly or mass.  ?   Tenderness: There is no abdominal tenderness. There is no guarding.  ?   Hernia: There is no hernia in the umbilical area or ventral area.  ?Musculoskeletal:     ?   General: Normal range of motion.  ?   Cervical back: Normal, full passive range of motion without pain, normal range of motion and neck supple.  ?   Thoracic back: Normal.  ?   Lumbar back: Normal.  ?   Right lower leg: No edema.  ?   Left lower leg: No edema.  ?Lymphadenopathy:  ?   Head:   ?   Right side of head: No submental, submandibular or tonsillar adenopathy.  ?   Left side of head: No submental, submandibular or  tonsillar adenopathy.  ?   Cervical: No cervical adenopathy.  ?   Right cervical: No superficial, deep or posterior cervical adenopathy. ?   Left cervical: No superficial, deep or posterior cervical adenopathy.  ?Skin: ?   General: Skin is warm and dry.  ?   Capillary Refill: Capillary refill takes 2 to 3 seconds.  ?Neurological:  ?   Mental Status: She is alert.  ?   Cranial Nerves: Cranial nerves 2-12 are intact.  ?   Deep Tendon Reflexes:  ?   Reflex Scores: ?     Tricep reflexes are 3+ on the right side and 3+ on the left side. ?     Bicep reflexes are 3+ on the right side and 3+ on the left side. ?     Brachioradialis reflexes are 3+ on the right side and 3+ on the left side. ?     Patellar reflexes are 3+ on the right side and 3+ on the left side. ?     Achilles reflexes are 3+ on the right side and 3+ on the left side. ?Psychiatric:     ?   Behavior: Behavior is cooperative.  ? ? ?Wt Readings from Last 3 Encounters:  ?02/19/22 129 lb (58.5 kg)  ?01/19/22 127 lb (57.6 kg)  ?11/06/21 127 lb (57.6 kg)  ? ? ?BP 120/70   Pulse 80   Ht 4\' 11"  (1.499 m)   Wt 129 lb (58.5 kg)   LMP 02/12/2022 (Exact Date)   BMI 26.05 kg/m?  ? ?Assessment and Plan: ?Ann Rogers is a 46 y.o. female who presents today for her Complete Annual Exam. She feels well. She reports exercising . She reports she is sleeping fairly well (but wakes up often at night with the urge to eat).  Patient's chart was reviewed for previous encounters most recent labs most recently imaging and Care Everywhere. ? ?1. Annual physical exam ?Immunizations are reviewed and recommendations provided.   Age appropriate screening tests are discussed. Counseling given for risk factor reduction interventions.  No subjective/objective concerns other than the pallor of the mucous of brains and decrease in capillary refill  noted. ?- Lipid Panel With LDL/HDL Ratio ?- Renal Function Panel ? ?2. Other fatigue ?Chronic.  Controlled.  Stable.  Patient does have a history of hypothyroidism.  Patient does have a history of having to wake up

## 2022-02-20 LAB — CBC WITH DIFFERENTIAL/PLATELET
Basophils Absolute: 0.1 10*3/uL (ref 0.0–0.2)
Basos: 1 %
EOS (ABSOLUTE): 0.1 10*3/uL (ref 0.0–0.4)
Eos: 2 %
Hematocrit: 38.1 % (ref 34.0–46.6)
Hemoglobin: 12.2 g/dL (ref 11.1–15.9)
Immature Grans (Abs): 0 10*3/uL (ref 0.0–0.1)
Immature Granulocytes: 0 %
Lymphocytes Absolute: 2.1 10*3/uL (ref 0.7–3.1)
Lymphs: 29 %
MCH: 28 pg (ref 26.6–33.0)
MCHC: 32 g/dL (ref 31.5–35.7)
MCV: 87 fL (ref 79–97)
Monocytes Absolute: 0.5 10*3/uL (ref 0.1–0.9)
Monocytes: 7 %
Neutrophils Absolute: 4.4 10*3/uL (ref 1.4–7.0)
Neutrophils: 61 %
Platelets: 280 10*3/uL (ref 150–450)
RBC: 4.36 x10E6/uL (ref 3.77–5.28)
RDW: 12.5 % (ref 11.7–15.4)
WBC: 7.2 10*3/uL (ref 3.4–10.8)

## 2022-02-20 LAB — RENAL FUNCTION PANEL
Albumin: 4.4 g/dL (ref 3.8–4.8)
BUN/Creatinine Ratio: 13 (ref 9–23)
BUN: 9 mg/dL (ref 6–24)
CO2: 24 mmol/L (ref 20–29)
Calcium: 8.8 mg/dL (ref 8.7–10.2)
Chloride: 104 mmol/L (ref 96–106)
Creatinine, Ser: 0.68 mg/dL (ref 0.57–1.00)
Glucose: 81 mg/dL (ref 70–99)
Phosphorus: 3.2 mg/dL (ref 3.0–4.3)
Potassium: 4.7 mmol/L (ref 3.5–5.2)
Sodium: 142 mmol/L (ref 134–144)
eGFR: 110 mL/min/{1.73_m2} (ref >59)

## 2022-02-20 LAB — HEPATIC FUNCTION PANEL (6)
ALT: 13 IU/L (ref 0–32)
AST: 17 IU/L (ref 0–40)
Alkaline Phosphatase: 55 IU/L (ref 44–121)
Bilirubin Total: 0.3 mg/dL (ref 0.0–1.2)
Bilirubin, Direct: 0.1 mg/dL (ref 0.00–0.40)

## 2022-02-20 LAB — LIPID PANEL WITH LDL/HDL RATIO
Cholesterol, Total: 180 mg/dL (ref 100–199)
HDL: 68 mg/dL (ref >39)
LDL Chol Calc (NIH): 103 mg/dL — ABNORMAL HIGH (ref 0–99)
LDL/HDL Ratio: 1.5 ratio (ref 0.0–3.2)
Triglycerides: 42 mg/dL (ref 0–149)
VLDL Cholesterol Cal: 9 mg/dL (ref 5–40)

## 2022-02-20 LAB — VITAMIN D 25 HYDROXY (VIT D DEFICIENCY, FRACTURES): Vit D, 25-Hydroxy: 46.2 ng/mL (ref 30.0–100.0)

## 2022-03-03 DIAGNOSIS — L308 Other specified dermatitis: Secondary | ICD-10-CM | POA: Diagnosis not present

## 2022-07-15 ENCOUNTER — Other Ambulatory Visit: Payer: Self-pay | Admitting: Obstetrics and Gynecology

## 2022-07-15 ENCOUNTER — Ambulatory Visit
Admission: RE | Admit: 2022-07-15 | Discharge: 2022-07-15 | Disposition: A | Discharge status: Home / Self Care | Payer: Federal, State, Local not specified - PPO | Source: Ambulatory Visit | Attending: Obstetrics and Gynecology | Admitting: Obstetrics and Gynecology

## 2022-07-15 DIAGNOSIS — N6489 Other specified disorders of breast: Secondary | ICD-10-CM

## 2022-07-15 DIAGNOSIS — R922 Inconclusive mammogram: Secondary | ICD-10-CM | POA: Diagnosis not present

## 2022-11-12 ENCOUNTER — Ambulatory Visit: Payer: Federal, State, Local not specified - PPO | Admitting: Internal Medicine

## 2022-11-12 ENCOUNTER — Encounter: Payer: Self-pay | Admitting: Internal Medicine

## 2022-11-12 VITALS — BP 110/76 | HR 76 | Ht 59.0 in | Wt 132.2 lb

## 2022-11-12 DIAGNOSIS — E039 Hypothyroidism, unspecified: Secondary | ICD-10-CM

## 2022-11-12 LAB — T4, FREE: Free T4: 1.02 ng/dL (ref 0.60–1.60)

## 2022-11-12 LAB — TSH: TSH: 1.64 u[IU]/mL (ref 0.35–5.50)

## 2022-11-12 MED ORDER — LEVOTHYROXINE SODIUM 50 MCG PO TABS: 50.0000 ug | ORAL_TABLET | Freq: Every day | ORAL | 3 refills | Status: DC

## 2022-11-12 NOTE — Progress Notes (Signed)
Patient ID: ASAL TEAS, female   DOB: 03-Feb-1976, 46 y.o.   MRN: [id number]  HPI  Ann Rogers is a 46 y.o.-year-old female, returning for f/u for hypothyroidism, dx 07/2013. Last visit 1 year ago.  Interim history: Her father died unexpectedly 2022-02-27 >> she is grieving. She has fatigue, cannot sleep. She is still commuting to work - 1h 20 min.   Reviewed history: Pt. has been dx with hypothyroidism in 07/2013 (TSH high, but pt not told how high, and records not available) >> started on Levothyroxine 25 mcg.  During pregnancy, with increased the dose of her levothyroxine to 75 mcg daily and we decreased it back after the pregnancy to 50 mcg daily.  Pt is on levothyroxine 50 mcg daily, taken: - in am - fasting - at least 30 min from b'fast - no Ca, Fe, PPIs - + MVIs at night - + on Biotin - last dose last night - 120 mcg  TFTs were reviewed: Lab Results  Component Value Date   TSH 1.50 11/06/2021   TSH 2.00 09/22/2020   TSH 2.07 09/17/2019   TSH 2.08 09/20/2018   TSH 1.71 04/13/2018   FREET4 1.13 11/06/2021   FREET4 1.13 09/22/2020   FREET4 0.98 09/17/2019   FREET4 1.07 09/20/2018   FREET4 0.99 04/13/2018   Her thyroid antibodies were not elevated:  Thyroid Peroxidase Antibody 10/19/2013 10.6  <35.0 IU/mL Final   Pt denies: - feeling nodules in neck - hoarseness - dysphagia - choking  No FH of thyroid disease or thyroid cancer. No h/o radiation tx to head or neck. No herbal supplements. No Biotin use. No recent steroids use.   She has a h/o vitamin D deficiency - now taking this daily. She stopped her prenatal vitamins. She previously lost weight on the [employer] diet -stopped 06/2020.  ROS: + see HPI  I reviewed pt's medications, allergies, PMH, social hx, family hx, and changes were documented in the history of present illness. Otherwise, unchanged from my initial visit note.  Past Medical History:  Diagnosis Date   AMA (advanced maternal age)  primigravida 35+    Hypothyroidism    IBS (irritable bowel syndrome)    Slow transit constipation 06/06/2014   She has MiraLax it sounds like, she responds to intermittent Dulcolax, Linzess 290 mcg not helpful    Past Surgical History:  Procedure Laterality Date   ANAL RECTAL MANOMETRY N/A 07/01/2014   Procedure: ANAL RECTAL MANOMETRY;  Surgeon: Iva Boop, MD;  Location: [facility];  Service: Endoscopy;  Laterality: N/A;   egg preservation     LASIK  2013   Social History   Socioeconomic History   Marital status: Single    Spouse name: Not on file   Number of children: 0   Years of education: Not on file   Highest education level: Not on file  Occupational History   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[employer]  Tobacco Use   Smoking status: Never   Smokeless tobacco: Never  Vaping Use   Vaping Use: Never used  Substance and Sexual Activity   Alcohol use: No   Drug use: No   Sexual activity: Not Currently  Other Topics Concern   Not on file  Social History Narrative   She is single, no children lives in [place]    [employer]   Regular exercise: no   Caffeine use: coffee in the am; 3-4  sodas daily   Last updated 06/06/2014            Social Determinants of Health   Financial Resource Strain: Not on file  Food Insecurity: Not on file  Transportation Needs: Not on file  Physical Activity: Not on file  Stress: Not on file  Social Connections: Not on file  Intimate Partner Violence: Not on file   Current Outpatient Medications on File Prior to Visit  Medication Sig Dispense Refill   Cholecalciferol (VITAMIN D) 50 MCG (2000 UT) CAPS Take 1 capsule (2,000 Units total) by mouth daily. 30 capsule 11   levothyroxine (SYNTHROID) 50 MCG tablet Take 1 tablet (50 mcg total) by mouth daily. 90 tablet 3   No current facility-administered medications on file prior to visit.   No  Known Allergies Family History  Problem Relation Age of Onset   Stroke Mother    Hypertension Father    Hyperlipidemia Father    Breast cancer Maternal Aunt 72   Hypertension Maternal Aunt    Hyperlipidemia Maternal Aunt    Cancer Maternal Grandfather    Miscarriages / [place] Cousin    Colon cancer Neg Hx    Colon polyps Neg Hx    Esophageal cancer Neg Hx    Kidney disease Neg Hx    PE: BP 110/76 (BP Location: Left Arm, Patient Position: Sitting, Cuff Size: Normal)   Pulse 76   Ht 4\' 11"  (1.499 m)   Wt 132 lb 3.2 oz (60 kg)   SpO2 99%   BMI 26.70 kg/m   Wt Readings from Last 3 Encounters:  11/12/22 132 lb 3.2 oz (60 kg)  02/19/22 129 lb (58.5 kg)  01/19/22 127 lb (57.6 kg)   Constitutional: normal weight, in NAD Eyes:  EOMI, no exophthalmos ENT: no neck masses, no cervical lymphadenopathy Cardiovascular: RRR, No MRG Respiratory: CTA B Musculoskeletal: no deformities Skin:no rashes Neurological: no tremor with outstretched hands  ASSESSMENT: 1. Hypothyroidism  PLAN:  1. Patient with controlled hypothyroidism, on low-dose levothyroxine - latest thyroid labs reviewed with pt. >> normal: Lab Results  Component Value Date   TSH 1.50 11/06/2021  - she continues on LT4 50 mcg daily - pt feels good on this dose.  She did have some fatigue at last visit as her daughter was not sleeping well through the night.  Also, she was commuting.  She is even more fatigued now as she is grieving for her father and cannot sleep. No other symptoms. - we discussed about taking the thyroid hormone every day, with water, >30 minutes before breakfast, separated by >4 hours from acid reflux medications, calcium, iron, multivitamins. Pt. is taking it correctly. - will check thyroid tests today: TSH and fT4 - If labs are abnormal, she will need to return for repeat TFTs in 1.5 months - OTW, I will see her back in a year  She needs refills.   Component     Latest Ref Rng 11/12/2022   T4,Free(Direct)     0.60 - 1.60 ng/dL 1.02   TSH     0.35 - 5.50 uIU/mL 1.64    TFTs are normal.  Will refill the same dose of levothyroxine.  Philemon Kingdom, MD PhD [facility]

## 2022-11-12 NOTE — Patient Instructions (Signed)
Please continue Levothyroxine 50 mcg daily.  Take the thyroid hormone every day, with water, at least 30 minutes before breakfast, separated by at least 4 hours from: - acid reflux medications - calcium - iron - multivitamins  Please stop at the lab.  Please come back for a follow-up appointment in 1 year. 

## 2023-01-17 ENCOUNTER — Ambulatory Visit
Admission: RE | Admit: 2023-01-17 | Discharge: 2023-01-17 | Disposition: A | Discharge status: Home / Self Care | Payer: Federal, State, Local not specified - PPO | Source: Ambulatory Visit | Attending: Obstetrics and Gynecology | Admitting: Obstetrics and Gynecology

## 2023-01-17 DIAGNOSIS — R922 Inconclusive mammogram: Secondary | ICD-10-CM | POA: Diagnosis not present

## 2023-01-17 DIAGNOSIS — N6489 Other specified disorders of breast: Secondary | ICD-10-CM

## 2023-02-21 ENCOUNTER — Encounter: Payer: Self-pay | Admitting: Family Medicine

## 2023-02-21 ENCOUNTER — Ambulatory Visit (INDEPENDENT_AMBULATORY_CARE_PROVIDER_SITE_OTHER): Payer: Federal, State, Local not specified - PPO | Admitting: Family Medicine

## 2023-02-21 VITALS — BP 104/70 | HR 80 | Ht 59.0 in | Wt 136.0 lb

## 2023-02-21 DIAGNOSIS — Z0000 Encounter for general adult medical examination without abnormal findings: Secondary | ICD-10-CM

## 2023-02-21 DIAGNOSIS — Z1211 Encounter for screening for malignant neoplasm of colon: Secondary | ICD-10-CM | POA: Diagnosis not present

## 2023-02-21 DIAGNOSIS — R5383 Other fatigue: Secondary | ICD-10-CM

## 2023-02-21 NOTE — Progress Notes (Signed)
Date:  02/21/2023   Name:  Ann Rogers   DOB:  1976/08/16   MRN:  [id number]   Chief Complaint: No chief complaint on file.  Patient is a 47 year old female who presents for a annual  physical exam. The patient reports the following problems: none. Health maintenance has been reviewed up to date.      Lab Results  Component Value Date   NA 142 02/19/2022   K 4.7 02/19/2022   CO2 24 02/19/2022   GLUCOSE 81 02/19/2022   BUN 9 02/19/2022   CREATININE 0.68 02/19/2022   CALCIUM 8.8 02/19/2022   EGFR 110 02/19/2022   GFRNONAA >89 04/15/2016   Lab Results  Component Value Date   CHOL 180 02/19/2022   HDL 68 02/19/2022   LDLCALC 103 (H) 02/19/2022   TRIG 42 02/19/2022   CHOLHDL 3.0 10/22/2020   Lab Results  Component Value Date   TSH 1.64 11/12/2022   No results found for: "HGBA1C" Lab Results  Component Value Date   WBC 7.2 02/19/2022   HGB 12.2 02/19/2022   HCT 38.1 02/19/2022   MCV 87 02/19/2022   PLT 280 02/19/2022   Lab Results  Component Value Date   ALT 13 02/19/2022   AST 17 02/19/2022   ALKPHOS 55 02/19/2022   BILITOT 0.3 02/19/2022   Lab Results  Component Value Date   VD25OH 46.2 02/19/2022     Review of Systems  Constitutional:  Positive for fatigue. Negative for fever and unexpected weight change.  HENT: Negative.  Negative for congestion, nosebleeds, postnasal drip, rhinorrhea, sore throat and trouble swallowing.   Eyes: Negative.   Respiratory: Negative.    Cardiovascular: Negative.   Gastrointestinal:  Positive for constipation. Negative for abdominal pain, blood in stool, diarrhea, nausea and vomiting.  Endocrine: Positive for cold intolerance. Negative for polydipsia, polyphagia and polyuria.  Genitourinary: Negative.  Negative for difficulty urinating, dyspareunia, dysuria, enuresis, flank pain, frequency, vaginal bleeding and vaginal pain.  Musculoskeletal: Negative.   Skin:  Negative for color change.  Neurological: Negative.    Hematological:  Negative for adenopathy. Does not bruise/bleed easily.    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  No outpatient medications have been marked as taking for the 02/21/23 encounter (Appointment) with Juline Patch, MD.       02/19/2022    9:55 AM 01/19/2022    2:08 PM  GAD 7 : Generalized Anxiety Score  Nervous, Anxious, on Edge 0 0  Control/stop worrying 0 0  Worry too much - different things 0 0  Trouble relaxing 0 0  Restless 0 0  Easily annoyed or irritable 0 0  Afraid - awful might happen 0 0  Total  GAD 7 Score 0 0  Anxiety Difficulty Not difficult at all Not difficult at all       02/19/2022    9:55 AM 01/19/2022    2:08 PM 10/22/2020    9:24 AM  Depression screen PHQ 2/9  Decreased Interest 0 0 0  Down, Depressed, Hopeless 0 0 0  PHQ - 2 Score 0 0 0  Altered sleeping 0 0 0  Tired, decreased energy 0 0 0  Change in appetite 0 0 0  Feeling bad or failure about yourself  0 0 0  Trouble concentrating 0 0 0  Moving slowly or fidgety/restless 0 0 0  Suicidal thoughts 0 0 0  PHQ-9 Score 0 0 0  Difficult doing work/chores Not difficult at all Not difficult at all Not difficult at all    BP Readings from Last 3 Encounters:  11/12/22 110/76  02/19/22 120/70  01/19/22 110/64     Physical Exam Vitals and nursing note reviewed. Exam conducted with a chaperone present.  Constitutional:      General: She is not in acute distress.    Appearance: She is not diaphoretic.  HENT:     Head: Normocephalic and atraumatic.     Right Ear: External ear normal.     Left Ear: External ear normal.     Nose: Nose normal.     Mouth/Throat:     Mouth: Mucous membranes are moist.  Eyes:     General:        Right eye: No discharge.        Left eye: No discharge.     Conjunctiva/sclera: Conjunctivae normal.     Pupils: Pupils are equal, round, and reactive to light.  Neck:     Thyroid: No thyromegaly.     Vascular: No JVD.  Cardiovascular:     Rate and Rhythm: Normal rate and regular rhythm.     Heart sounds: Normal heart sounds. No murmur heard.    No friction rub. No gallop.  Pulmonary:     Effort: Pulmonary effort is normal.     Breath sounds: Normal breath sounds. No wheezing, rhonchi or rales.  Abdominal:     General: Bowel sounds are normal.     Palpations: Abdomen is soft. There is no shifting dullness, fluid wave, hepatomegaly, splenomegaly or mass.     Tenderness: There is no abdominal tenderness. There is no guarding.  Genitourinary:    Uterus: Normal.      Adnexa:        Right: No tenderness or fullness.         Left: No tenderness or fullness.    Musculoskeletal:        General: Normal range of motion.     Cervical back: Normal range of motion and neck supple.  Lymphadenopathy:     Cervical: No cervical adenopathy.  Skin:    General: Skin is warm and dry.     Capillary Refill: Capillary refill takes less than 2 seconds.     Coloration: Skin is not ashen.  Neurological:     Mental Status: She is alert.     Deep Tendon Reflexes: Reflexes are normal and symmetric.     Wt Readings from Last 3 Encounters:  11/12/22 132 lb 3.2 oz (60 kg)  02/19/22 129 lb (58.5 kg)  01/19/22 127 lb (57.6 kg)    There were no vitals taken for this  visit.  Assessment and Plan: 1. Annual physical exam No subjective/objective concerns noted  during the history of present illness, review of past medical history/medications/labs, review of systems, and physical exam.  Will obtain lipid panel CMP and CBC for current status. - Lipid Panel With LDL/HDL Ratio - Comprehensive Metabolic Panel (CMET) - CBC with Differential/Platelet  2. Other fatigue Patient had fatigue is ongoing and is followed by endocrinology for thyroid but there is some pallor noted and we will check CBC for anemia. - Comprehensive Metabolic Panel (CMET) - CBC with Differential/Platelet  3. Colon cancer screening Discussed with patient and referral has been made to gastroenterology for colon cancer screening. - Ambulatory referral to Gastroenterology     Otilio Miu, MD

## 2023-02-22 LAB — LIPID PANEL WITH LDL/HDL RATIO
Cholesterol, Total: 197 mg/dL (ref 100–199)
HDL: 63 mg/dL (ref >39)
LDL Chol Calc (NIH): 128 mg/dL — ABNORMAL HIGH (ref 0–99)
LDL/HDL Ratio: 2 ratio (ref 0.0–3.2)
Triglycerides: 33 mg/dL (ref 0–149)
VLDL Cholesterol Cal: 6 mg/dL (ref 5–40)

## 2023-02-22 LAB — CBC WITH DIFFERENTIAL/PLATELET
Basophils Absolute: 0.1 10*3/uL (ref 0.0–0.2)
Basos: 1 %
EOS (ABSOLUTE): 0.1 10*3/uL (ref 0.0–0.4)
Eos: 1 %
Hematocrit: 39.5 % (ref 34.0–46.6)
Hemoglobin: 12.9 g/dL (ref 11.1–15.9)
Immature Grans (Abs): 0 10*3/uL (ref 0.0–0.1)
Immature Granulocytes: 0 %
Lymphocytes Absolute: 1.7 10*3/uL (ref 0.7–3.1)
Lymphs: 24 %
MCH: 28.9 pg (ref 26.6–33.0)
MCHC: 32.7 g/dL (ref 31.5–35.7)
MCV: 88 fL (ref 79–97)
Monocytes Absolute: 0.5 10*3/uL (ref 0.1–0.9)
Monocytes: 7 %
Neutrophils Absolute: 4.8 10*3/uL (ref 1.4–7.0)
Neutrophils: 67 %
Platelets: 249 10*3/uL (ref 150–450)
RBC: 4.47 x10E6/uL (ref 3.77–5.28)
RDW: 12.1 % (ref 11.7–15.4)
WBC: 7.2 10*3/uL (ref 3.4–10.8)

## 2023-02-22 LAB — COMPREHENSIVE METABOLIC PANEL
ALT: 19 IU/L (ref 0–32)
AST: 18 IU/L (ref 0–40)
Albumin/Globulin Ratio: 1.4 (ref 1.2–2.2)
Albumin: 4.2 g/dL (ref 3.9–4.9)
Alkaline Phosphatase: 53 IU/L (ref 44–121)
BUN/Creatinine Ratio: 20 (ref 9–23)
BUN: 16 mg/dL (ref 6–24)
Bilirubin Total: 0.2 mg/dL (ref 0.0–1.2)
CO2: 22 mmol/L (ref 20–29)
Calcium: 9 mg/dL (ref 8.7–10.2)
Chloride: 106 mmol/L (ref 96–106)
Creatinine, Ser: 0.8 mg/dL (ref 0.57–1.00)
Globulin, Total: 3 g/dL (ref 1.5–4.5)
Glucose: 86 mg/dL (ref 70–99)
Potassium: 4.6 mmol/L (ref 3.5–5.2)
Sodium: 140 mmol/L (ref 134–144)
Total Protein: 7.2 g/dL (ref 6.0–8.5)
eGFR: 93 mL/min/{1.73_m2} (ref >59)

## 2023-03-07 ENCOUNTER — Telehealth: Payer: Self-pay | Admitting: Family Medicine

## 2023-03-07 ENCOUNTER — Encounter: Payer: Self-pay | Admitting: Internal Medicine

## 2023-03-07 NOTE — Telephone Encounter (Signed)
Patient called regarding lab results.  Shared provider's note. Pt had a few questions, which were answered.  Duanne Limerick, MD 02/22/2023  7:22 AM EDT     Elevated LDL.  Send low-cholesterol low triglyceride guidelines recheck in 6 months.    Pt would like a copy of the lab results mailed to her. Dr. Yetta Barre also mentioned sending Low cholesterol/ low triglyceride guidelines to PT. Pt would like that sent as well.  Pt is waiting to hear about colonoscopy from office. Please follow up on scheduling this procedure.

## 2023-03-07 NOTE — Telephone Encounter (Signed)
Pt is calling to request if lab results could be results. CB- [phone]

## 2023-03-16 ENCOUNTER — Ambulatory Visit (AMBULATORY_SURGERY_CENTER): Payer: Federal, State, Local not specified - PPO

## 2023-03-16 VITALS — Ht 59.0 in | Wt 128.0 lb

## 2023-03-16 DIAGNOSIS — K5900 Constipation, unspecified: Secondary | ICD-10-CM

## 2023-03-16 DIAGNOSIS — Z1211 Encounter for screening for malignant neoplasm of colon: Secondary | ICD-10-CM

## 2023-03-16 MED ORDER — NA SULFATE-K SULFATE-MG SULF 17.5-3.13-1.6 GM/177ML PO SOLN: 1.0000 | Freq: Once | ORAL | 0 refills | Status: AC

## 2023-03-16 NOTE — Progress Notes (Signed)
No egg or soy allergy known to patient  No issues known to pt with past sedation with any surgeries or procedures Patient denies ever being told they had issues or difficulty with intubation  No FH of Malignant Hyperthermia Pt is not on diet pills Pt is not on  home 02  Pt is not on blood thinners  Pt with constipation. BM once a week as her normal does not require the patient to strain. Instructions given for extra miralax starting 7 days prior to her procedure  No A fib or A flutter Have any cardiac testing pending--no  Pt reports full mobility and movement  Pt instructed to use Singlecare.com or GoodRx for a price reduction on prep    Patient's chart reviewed by Cathlyn Parsons CNRA prior to previsit and patient appropriate for the [facility].  Previsit completed and red dot placed by patient's name on their procedure day (on provider's schedule).

## 2023-04-08 ENCOUNTER — Telehealth: Payer: Self-pay | Admitting: Internal Medicine

## 2023-04-08 ENCOUNTER — Encounter: Payer: Self-pay | Admitting: Internal Medicine

## 2023-04-08 NOTE — Telephone Encounter (Signed)
Called patient and sent new instructions in My Chart and reviewed instructions. Patient verbalized understanding of instructions.

## 2023-04-08 NOTE — Telephone Encounter (Signed)
Inbound call from patient, procedure is scheduled for 5/14 and patient still has not yet received her instructions. Is wondering if instructions can be emailed to her, or if a nurse can call her to go over instructions again. Patient is not on MyChart so is unable to retrieve prep instructions there.

## 2023-04-08 NOTE — Telephone Encounter (Signed)
Refer to phone note 04/07/22. Previsit previously notified.

## 2023-04-11 DIAGNOSIS — Z01419 Encounter for gynecological examination (general) (routine) without abnormal findings: Secondary | ICD-10-CM | POA: Diagnosis not present

## 2023-04-12 ENCOUNTER — Ambulatory Visit (AMBULATORY_SURGERY_CENTER): Payer: Federal, State, Local not specified - PPO | Admitting: Internal Medicine

## 2023-04-12 ENCOUNTER — Telehealth: Payer: Self-pay | Admitting: Internal Medicine

## 2023-04-12 ENCOUNTER — Encounter: Payer: Self-pay | Admitting: Internal Medicine

## 2023-04-12 VITALS — BP 126/62 | HR 61 | Temp 98.4°F | Resp 17 | Ht 59.0 in | Wt 128.0 lb

## 2023-04-12 DIAGNOSIS — Z1211 Encounter for screening for malignant neoplasm of colon: Secondary | ICD-10-CM

## 2023-04-12 LAB — HM COLONOSCOPY

## 2023-04-12 MED ORDER — SODIUM CHLORIDE 0.9 % IV SOLN: 500.0000 mL | Freq: Once | INTRAVENOUS | Status: DC

## 2023-04-12 NOTE — Progress Notes (Signed)
Vss nad trans to pacu 

## 2023-04-12 NOTE — Progress Notes (Signed)
1120: Patient given 100mg  propofol.  IV infiltrated.    1125: New IV placed 24G R hand induction of anesthesia commenced.

## 2023-04-12 NOTE — Patient Instructions (Addendum)
YOU HAD AN ENDOSCOPIC PROCEDURE TODAY AT THE [facility]:   Refer to the procedure report that was given to you for any specific questions about what was found during the examination.  If the procedure report does not answer your questions, please call your gastroenterologist to clarify.  If you requested that your care partner not be given the details of your procedure findings, then the procedure report has been included in a sealed envelope for you to review at your convenience later.  YOU SHOULD EXPECT: Some feelings of bloating in the abdomen. Passage of more gas than usual.  Walking can help get rid of the air that was put into your GI tract during the procedure and reduce the bloating. If you had a lower endoscopy (such as a colonoscopy or flexible sigmoidoscopy) you may notice spotting of blood in your stool or on the toilet paper. If you underwent a bowel prep for your procedure, you may not have a normal bowel movement for a few days.  Please Note:  You might notice some irritation and congestion in your nose or some drainage.  This is from the oxygen used during your procedure.  There is no need for concern and it should clear up in a day or so.  SYMPTOMS TO REPORT IMMEDIATELY:  Following lower endoscopy (colonoscopy or flexible sigmoidoscopy):  Excessive amounts of blood in the stool  Significant tenderness or worsening of abdominal pains  Swelling of the abdomen that is new, acute  Fever of 100F or higher   For urgent or emergent issues, a gastroenterologist can be reached at any hour by calling [phone]. Do not use MyChart messaging for urgent concerns.    DIET:  We do recommend a small meal at first, but then you may proceed to your regular diet.  Drink plenty of fluids but you should avoid alcoholic beverages for 24 hours.  MEDICATIONS: Continue present medications.  FOLLOW UP: Repeat colonoscopy in 10 years for screening purposes. Return visit for  constipation - Dr. Marvell Fuller office nurse will call you to follow up on this.  Thank you for allowing [place] to provide for your healthcare needs today.  ACTIVITY:  You should plan to take it easy for the rest of today and you should NOT DRIVE or use heavy machinery until tomorrow (because of the sedation medicines used during the test).    FOLLOW UP: Our staff will call the number listed on your records the next business day following your procedure.  We will call around 7:15- 8:00 am to check on you and address any questions or concerns that you may have regarding the information given to you following your procedure. If we do not reach you, we will leave a message.     If any biopsies were taken you will be contacted by phone or by letter within the next 1-3 weeks.  Please call us at [phone] if you have not heard about the biopsies in 3 weeks.    SIGNATURES/CONFIDENTIALITY: You and/or your care partner have signed paperwork which will be entered into your electronic medical record.  These signatures attest to the fact that that the information above on your After Visit Summary has been reviewed and is understood.  Full responsibility of the confidentiality of this discharge information lies with you and/or your care-partner.I am pleased to report that no polyps or cancer were seen.  Next routine colonoscopy or other screening test in 10 years - 2034.  If the constipation  is a problem I can see you in the office again - let me know.  I do send a lot of patients for pelvic floor PT these days and it is quite helpful.  I appreciate the opportunity to care for you. Iva Boop, MD, [facility]

## 2023-04-12 NOTE — Telephone Encounter (Signed)
Patient needs f/u visit w/ me - July is ok - I have some nurse visits that can be used  Had colonoscopy this AM so can call later today or tomorrow  Dx Chronic constipation

## 2023-04-12 NOTE — Progress Notes (Signed)
[facility] History and Physical   Primary Care Physician:  Duanne Limerick, MD   Reason for Procedure:   CRCA screening  Plan:    colonoscopy     HPI: Ann Rogers is a 47 y.o. female w/ hx constipation here for screening exam   Past Medical History:  Diagnosis Date   AMA (advanced maternal age) primigravida 35+    Hypothyroidism    IBS (irritable bowel syndrome)    Slow transit constipation 06/06/2014   She has MiraLax it sounds like, she responds to intermittent Dulcolax, Linzess 290 mcg not helpful     Past Surgical History:  Procedure Laterality Date   ANAL RECTAL MANOMETRY N/A 07/01/2014   Procedure: ANAL RECTAL MANOMETRY;  Surgeon: Iva Boop, MD;  Location: [facility];  Service: Endoscopy;  Laterality: N/A;   egg preservation     LASIK  2013    Prior to Admission medications   Medication Sig Start Date End Date Taking? Authorizing Provider  levothyroxine (SYNTHROID) 50 MCG tablet Take 1 tablet (50 mcg total) by mouth daily. 11/12/22  Yes Carlus Pavlov, MD  VITAMIN D, CHOLECALCIFEROL, PO Take 10,000 Units by mouth daily.   Yes [provider]    Current Outpatient Medications  Medication Sig Dispense Refill   levothyroxine (SYNTHROID) 50 MCG tablet Take 1 tablet (50 mcg total) by mouth daily. 90 tablet 3   VITAMIN D, CHOLECALCIFEROL, PO Take 10,000 Units by mouth daily.     Current Facility-Administered Medications  Medication Dose Route Frequency Provider Last Rate Last Admin   0.9 %  sodium chloride infusion  500 mL Intravenous Once Iva Boop, MD        Allergies as of 04/12/2023   (No Known Allergies)    Family History  Problem Relation Age of Onset   Stroke Mother    Hypertension Father    Hyperlipidemia Father    Breast cancer Maternal Aunt 72   Hypertension Maternal Aunt    Hyperlipidemia Maternal Aunt    Cancer Maternal Grandfather    Miscarriages / Stillbirths Cousin    Colon cancer Neg Hx    Colon  polyps Neg Hx    Esophageal cancer Neg Hx    Kidney disease Neg Hx    Rectal cancer Neg Hx    Stomach cancer Neg Hx     Social History   Socioeconomic History   Marital status: Single    Spouse name: Not on file   Number of children: 0   Years of education: Not on file   Highest education level: Not on file  Occupational History   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[employer]  Tobacco Use   Smoking status: Never   Smokeless tobacco: Never  Vaping Use   Vaping Use: Never used  Substance and Sexual Activity   Alcohol use: No   Drug use: No   Sexual activity: Not Currently  Other Topics Concern   Not on file  Social History Narrative   One child, [place] in [place] Asst Advice worker AirportRegular exercise: noCaffeine use: coffee in the am; 3-4 sodas daily   Social Determinants of Health   Financial Resource Strain: Not on file  Food Insecurity: Not on file  Transportation Needs: Not on file  Physical Activity: Not on file  Stress: Not on file  Social Connections: Not on file  Intimate Partner Violence: Not on file    Review of Systems:  All other review of systems negative except as mentioned in the HPI.  Physical Exam: Vital signs BP 128/71   Pulse 65   Temp 98.4 F (36.9 C)   Ht 4\' 11"  (1.499 m)   Wt 128 lb (58.1 kg)   LMP 03/26/2023 (Exact Date)   SpO2 100%   BMI 25.85 kg/m   General:   Alert,  Well-developed, well-nourished, pleasant and cooperative in NAD Lungs:  Clear throughout to auscultation.   Heart:  Regular rate and rhythm; no murmurs, clicks, rubs,  or gallops. Abdomen:  Soft, nontender and nondistended. Normal bowel sounds.   Neuro/Psych:  Alert and cooperative. Normal mood and affect. A and O x 3   @Marius Betts  Sena Slate, MD, [facility] [phone] (pager) 04/12/2023 11:05 AM@

## 2023-04-12 NOTE — Op Note (Signed)
[facility] Patient Name: Ann Rogers Procedure Date: 04/12/2023 11:01 AM MRN: [id number] Endoscopist: Iva Boop , MD, [id number] Age: 47 Referring MD:  Date of Birth: 01-Dec-1975 Gender: Female Account #: [id number] Procedure:                Colonoscopy Indications:              Screening for colorectal malignant neoplasm, This                            is the patient's first colonoscopy Medicines:                Monitored Anesthesia Care Procedure:                Pre-Anesthesia Assessment:                           - Prior to the procedure, a History and Physical                            was performed, and patient medications and                            allergies were reviewed. The patient's tolerance of                            previous anesthesia was also reviewed. The risks                            and benefits of the procedure and the sedation                            options and risks were discussed with the patient.                            All questions were answered, and informed consent                            was obtained. Prior Anticoagulants: The patient has                            taken no anticoagulant or antiplatelet agents. ASA                            Grade Assessment: II - A patient with mild systemic                            disease. After reviewing the risks and benefits,                            the patient was deemed in satisfactory condition to                            undergo the procedure.  After obtaining informed consent, the colonoscope                            was passed under direct vision. Throughout the                            procedure, the patient's blood pressure, pulse, and                            oxygen saturations were monitored continuously. The                            Olympus SN [id number] was introduced through the anus                            and advanced to  the the terminal ileum, with                            identification of the appendiceal orifice and IC                            valve. The colonoscopy was performed without                            difficulty. The patient tolerated the procedure                            well. The quality of the bowel preparation was                            good. The terminal ileum, ileocecal valve,                            appendiceal orifice, and rectum were photographed.                            The bowel preparation used was SUPREP via split                            dose instruction. Scope In: 11:29:51 AM Scope Out: 11:41:20 AM Scope Withdrawal Time: 0 hours 7 minutes 51 seconds  Total Procedure Duration: 0 hours 11 minutes 29 seconds  Findings:                 The perianal and digital rectal examinations were                            normal.                           The entire examined colon appeared normal on direct                            and retroflexion views. Complications:            No  immediate complications. Estimated Blood Loss:     Estimated blood loss: none. Impression:               - The entire examined colon is normal on direct and                            retroflexion views.                           - No specimens collected. Recommendation:           - Patient has a contact number available for                            emergencies. The signs and symptoms of potential                            delayed complications were discussed with the                            patient. Return to normal activities tomorrow.                            Written discharge instructions were provided to the                            patient.                           - Resume previous diet.                           - Continue present medications.                           - Repeat colonoscopy in 10 years for screening                            purposes.                            - return visit as needed for constioation - she has                            been much better while breast-feeding but having                            some issues again now that she has stopped                            breast-feeding Iva Boop, MD 04/12/2023 11:50:23 AM This report has been signed electronically.

## 2023-04-12 NOTE — Progress Notes (Signed)
Pt's states no medical or surgical changes since previsit or office visit. 

## 2023-04-13 ENCOUNTER — Telehealth: Payer: Self-pay | Admitting: *Deleted

## 2023-04-13 ENCOUNTER — Other Ambulatory Visit: Payer: Self-pay | Admitting: Obstetrics and Gynecology

## 2023-04-13 DIAGNOSIS — Z803 Family history of malignant neoplasm of breast: Secondary | ICD-10-CM

## 2023-04-13 NOTE — Telephone Encounter (Signed)
  Follow up Call-    Row Labels 04/12/2023   10:39 AM  Call back number   Section Header. No data exists in this row.   Post procedure Call Back phone  #   [phone]  Permission to leave phone message   Yes     Patient questions:  Do you have a fever, pain , or abdominal swelling? No. Pain Score  0 *  Have you tolerated food without any problems? Yes.    Have you been able to return to your normal activities? Yes.    Do you have any questions about your discharge instructions: Diet   No. Medications  No. Follow up visit  No.  Do you have questions or concerns about your Care? No.  Actions: * If pain score is 4 or above: No action needed, pain <4.

## 2023-04-13 NOTE — Telephone Encounter (Signed)
Ann Rogers has set up a f/u appointment 06/01/2023 at 8:50AM.

## 2023-05-17 ENCOUNTER — Encounter: Payer: Self-pay | Admitting: Family Medicine

## 2023-05-17 ENCOUNTER — Ambulatory Visit: Payer: Federal, State, Local not specified - PPO | Admitting: Family Medicine

## 2023-05-17 VITALS — BP 121/80 | HR 68 | Ht 59.0 in | Wt 134.6 lb

## 2023-05-17 DIAGNOSIS — Z7689 Persons encountering health services in other specified circumstances: Secondary | ICD-10-CM | POA: Diagnosis not present

## 2023-05-17 DIAGNOSIS — K5901 Slow transit constipation: Secondary | ICD-10-CM | POA: Diagnosis not present

## 2023-05-17 DIAGNOSIS — E039 Hypothyroidism, unspecified: Secondary | ICD-10-CM | POA: Diagnosis not present

## 2023-05-17 DIAGNOSIS — E7800 Pure hypercholesterolemia, unspecified: Secondary | ICD-10-CM | POA: Insufficient documentation

## 2023-05-17 DIAGNOSIS — G479 Sleep disorder, unspecified: Secondary | ICD-10-CM

## 2023-05-17 NOTE — Assessment & Plan Note (Signed)
Chronic  Stable  Followed by endocrinology  Continue levothyroxine daily  Follow up as scheduled with Dr. Lafe Garin

## 2023-05-17 NOTE — Patient Instructions (Addendum)
[email or web address]  Please visit the lab one week prior to your next appt for your cholesterol and vitamin D.   Lab is open between 8A-11:30A and 1-4:30 PM   Mediterranean Diet A Mediterranean diet refers to food and lifestyle choices that are based on the traditions of countries located on the [employer]. It focuses on eating more fruits, vegetables, whole grains, beans, nuts, seeds, and heart-healthy fats, and eating less dairy, meat, eggs, and processed foods with added sugar, salt, and fat. This way of eating has been shown to help prevent certain conditions and improve outcomes for people who have chronic diseases, like kidney disease and heart disease. What are tips for following this plan? Reading food labels Check the serving size of packaged foods. For foods such as rice and pasta, the serving size refers to the amount of cooked product, not dry. Check the total fat in packaged foods. Avoid foods that have saturated fat or trans fats. Check the ingredient list for added sugars, such as corn syrup. Shopping  Buy a variety of foods that offer a balanced diet, including: Fresh fruits and vegetables (produce). Grains, beans, nuts, and seeds. Some of these may be available in unpackaged forms or large amounts (in bulk). Fresh seafood. Poultry and eggs. Low-fat dairy products. Buy whole ingredients instead of prepackaged foods. Buy fresh fruits and vegetables in-season from local farmers markets. Buy plain frozen fruits and vegetables. If you do not have access to quality fresh seafood, buy precooked frozen shrimp or canned fish, such as tuna, salmon, or sardines. Stock your pantry so you always have certain foods on hand, such as olive oil, canned tuna, canned tomatoes, rice, pasta, and beans. Cooking Cook foods with extra-virgin olive oil instead of using butter or other vegetable oils. Have meat as a side dish, and have vegetables or grains as your main dish. This means  having meat in small portions or adding small amounts of meat to foods like pasta or stew. Use beans or vegetables instead of meat in common dishes like chili or lasagna. Experiment with different cooking methods. Try roasting, broiling, steaming, and sauting vegetables. Add frozen vegetables to soups, stews, pasta, or rice. Add nuts or seeds for added healthy fats and plant protein at each meal. You can add these to yogurt, salads, or vegetable dishes. Marinate fish or vegetables using olive oil, lemon juice, garlic, and fresh herbs. Meal planning Plan to eat one vegetarian meal one day each week. Try to work up to two vegetarian meals, if possible. Eat seafood two or more times a week. Have healthy snacks readily available, such as: Vegetable sticks with hummus. Greek yogurt. Fruit and nut trail mix. Eat balanced meals throughout the week. This includes: Fruit: 2-3 servings a day. Vegetables: 4-5 servings a day. Low-fat dairy: 2 servings a day. Fish, poultry, or lean meat: 1 serving a day. Beans and legumes: 2 or more servings a week. Nuts and seeds: 1-2 servings a day. Whole grains: 6-8 servings a day. Extra-virgin olive oil: 3-4 servings a day. Limit red meat and sweets to only a few servings a month. Lifestyle  Cook and eat meals together with your family, when possible. Drink enough fluid to keep your urine pale yellow. Be physically active every day. This includes: Aerobic exercise like running or swimming. Leisure activities like gardening, walking, or housework. Get 7-8 hours of sleep each night. If recommended by your health care provider, drink red wine in moderation. This means 1 glass a day for  nonpregnant women and 2 glasses a day for men. A glass of wine equals 5 oz (150 mL). What foods should I eat? Fruits Apples. Apricots. Avocado. Berries. Bananas. Cherries. Dates. Figs. Grapes. Lemons. Melon. Oranges. Peaches. Plums. Pomegranate. Vegetables Artichokes. Beets.  Broccoli. Cabbage. Carrots. Eggplant. Green beans. Chard. Kale. Spinach. Onions. Leeks. Peas. Squash. Tomatoes. Peppers. Radishes. Grains Whole-grain pasta. Brown rice. Bulgur wheat. Polenta. Couscous. Whole-wheat bread. Orpah Cobb. Meats and other proteins Beans. Almonds. Sunflower seeds. Pine nuts. Peanuts. Cod. Salmon. Scallops. Shrimp. Tuna. Tilapia. Clams. Oysters. Eggs. Poultry without skin. Dairy Low-fat milk. Cheese. Greek yogurt. Fats and oils Extra-virgin olive oil. Avocado oil. Grapeseed oil. Beverages Water. Red wine. Herbal tea. Sweets and desserts Greek yogurt with honey. Baked apples. Poached pears. Trail mix. Seasonings and condiments Basil. Cilantro. Coriander. Cumin. Mint. Parsley. Sage. Rosemary. Tarragon. Garlic. Oregano. Thyme. Pepper. Balsamic vinegar. Tahini. Hummus. Tomato sauce. Olives. Mushrooms. The items listed above may not be a complete list of foods and beverages you can eat. Contact a dietitian for more information. What foods should I limit? This is a list of foods that should be eaten rarely or only on special occasions. Fruits Fruit canned in syrup. Vegetables Deep-fried potatoes (french fries). Grains Prepackaged pasta or rice dishes. Prepackaged cereal with added sugar. Prepackaged snacks with added sugar. Meats and other proteins Beef. Pork. Lamb. Poultry with skin. Hot dogs. Tomasa Blase. Dairy Ice cream. Sour cream. Whole milk. Fats and oils Butter. Canola oil. Vegetable oil. Beef fat (tallow). Lard. Beverages Juice. Sugar-sweetened soft drinks. Beer. Liquor and spirits. Sweets and desserts Cookies. Cakes. Pies. Candy. Seasonings and condiments Mayonnaise. Pre-made sauces and marinades. The items listed above may not be a complete list of foods and beverages you should limit. Contact a dietitian for more information. Summary The Mediterranean diet includes both food and lifestyle choices. Eat a variety of fresh fruits and vegetables, beans,  nuts, seeds, and whole grains. Limit the amount of red meat and sweets that you eat. If recommended by your health care provider, drink red wine in moderation. This means 1 glass a day for nonpregnant women and 2 glasses a day for men. A glass of wine equals 5 oz (150 mL). This information is not intended to replace advice given to you by your health care provider. Make sure you discuss any questions you have with your health care provider. Document Revised: 12/21/2019 Document Reviewed: 10/18/2019 Elsevier Patient Education  2023 [employer].

## 2023-05-17 NOTE — Assessment & Plan Note (Signed)
Chronic  Follows with Dr. Leone Payor, GI  Patient will follow up as scheduled  We discussed increasing water intake to help with bowel movements as patients reports little water intake currently  No current medications  Patient states she is not interested in restarting medications including linzess due to hair loss previously

## 2023-05-17 NOTE — Assessment & Plan Note (Signed)
Chronic  Reports mind racing most nights  She does not prefer medications  Reports no response to ambien for sleep in the past  We discussed wine down routine including melatonin 1 hour prior to desired bed time, sleeping in a cool dark room, no screens immediately before bed or while sleeping, recommended avoiding alcohol (patient states she is not drinking), and to stop consuming caffeine beverages after 4-5PM  Also recommended [email or web address] to establish with therapist with additional recommendations for improving sleep and managing busy schedule

## 2023-05-17 NOTE — Progress Notes (Signed)
I,Sha'taria Tyson,acting as a Neurosurgeon for [employer], MD.,have documented all relevant documentation on the behalf of Ronnald Ramp, MD,as directed by  Ronnald Ramp, MD while in the presence of Ronnald Ramp, MD.  New patient visit   Patient: Ann Rogers   DOB: 1975-12-18   47 y.o. Female  MRN: [id number] Visit Date: 05/17/2023  Today's healthcare provider: Ronnald Ramp, MD   Chief Complaint  Patient presents with   Establish Care   Subjective    Ann Rogers is a 47 y.o. female who presents today as a new patient to establish care.  HPI   Encounter to Establish Care Patient presents to establish care  Introduced myself and my role as primary care physician  We reviewed patient's medical, surgical, and social history and medications as listed below    PMHX   Last annual physical: 02/21/23   Irritable Bowel Syndrome  Medications: none currently Reports symptoms have been stable reports normally having bowel movement once weekly  She has upcoming appt with specialist Followed by Dr. Stan Head for gastroenterology   Hypothyroidism Medications: Levothyroxine   Sees endocrinology once annually, Dr. Lafe Garin with [facility] in [place]   Vitamin D Deficiency   Medications: 10,000 international units  Reports taking supplement inconsistently   Social Hx  Tobacco use: never  Alcohol Use : denies  Illicit drug use: denies     Concerns for Today:  -Patient reports receiving conflicting information in regards to her lab work from 03/24 and would like to further discuss for her cholesterol   Sleep Disturbance: reports often having difficulty with falling asleep, reports drinking up to 5 cokes per day, also drinks coffee, reports having a 69 year old daughter and puppy, single parent works long hours sometimes 8a-11p (on call), she does not drink alcohol, has not tried melatonin in the past for  sleep, did not notice improved sleep on ambien in the past, patient is seeking advice on methods to wine down for sleep   Past Medical History:  Diagnosis Date   AMA (advanced maternal age) primigravida 35+    Hypothyroidism    IBS (irritable bowel syndrome)    Slow transit constipation 06/06/2014   She has MiraLax it sounds like, she responds to intermittent Dulcolax, Linzess 290 mcg not helpful    Past Surgical History:  Procedure Laterality Date   ANAL RECTAL MANOMETRY N/A 07/01/2014   Procedure: ANAL RECTAL MANOMETRY;  Surgeon: Iva Boop, MD;  Location: [facility];  Service: Endoscopy;  Laterality: N/A;   COLONOSCOPY     egg preservation     LASIK  11/30/2011   Family Status  Relation Name Status   Mother  Deceased   Father  Alive   Sister  Alive   Mat Aunt  (Not Specified)   MGM  Deceased   MGF  Deceased   PGM  Deceased   PGF  Deceased   Cousin  (Not Specified)   Neg Hx  (Not Specified)   Family History  Problem Relation Age of Onset   Stroke Mother 29   Hypertension Father    Hyperlipidemia Father    Breast cancer Maternal Aunt 72   Hypertension Maternal Aunt    Hyperlipidemia Maternal Aunt    Lung cancer Maternal Grandfather 32 - 34   Miscarriages / [place] Cousin    Colon cancer Neg Hx    Colon polyps Neg Hx    Esophageal cancer Neg Hx    Kidney disease Neg  Hx    Rectal cancer Neg Hx    Stomach cancer Neg Hx    Social History   Socioeconomic History   Marital status: Single    Spouse name: Not on file   Number of children: 0   Years of education: Not on file   Highest education level: Master's degree (e.g., MA, MS, MEng, MEd, MSW, MBA)  Occupational History   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[employer]  Tobacco Use   Smoking status: Never   Smokeless tobacco: Never  Vaping Use   Vaping Use: Never used  Substance and Sexual Activity   Alcohol use: No   Drug use: No   Sexual activity: Not  Currently  Other Topics Concern   Not on file  Social History Narrative   One child, [place] Asst Advice worker AirportRegular exercise: noCaffeine use: coffee in the am; 3-4 sodas daily   Social Determinants of Health   Financial Resource Strain: Low Risk  (05/16/2023)   Overall Financial Resource Strain (CARDIA)    Difficulty of Paying Living Expenses: Not hard at all  Food Insecurity: No Food Insecurity (05/16/2023)   Hunger Vital Sign    Worried About Running Out of Food in the Last Year: Never true    Ran Out of Food in the Last Year: Never true  Transportation Needs: No Transportation Needs (05/16/2023)   PRAPARE - Administrator, Civil Service (Medical): No    Lack of Transportation (Non-Medical): No  Physical Activity: Insufficiently Active (05/16/2023)   Exercise Vital Sign    Days of Exercise per Week: 3 days    Minutes of Exercise per Session: 30 min  Stress: No Stress Concern Present (05/16/2023)   [employer] of Occupational Health - Occupational Stress Questionnaire    Feeling of Stress : Only a little  Social Connections: Moderately Integrated (05/16/2023)   Social Connection and Isolation Panel [NHANES]    Frequency of Communication with Friends and Family: Three times a week    Frequency of Social Gatherings with Friends and Family: Three times a week    Attends Religious Services: More than 4 times per year    Active Member of Clubs or Organizations: Yes    Attends Banker Meetings: 1 to 4 times per year    Marital Status: Never married   Outpatient Medications Prior to Visit  Medication Sig   levothyroxine (SYNTHROID) 50 MCG tablet Take 1 tablet (50 mcg total) by mouth daily.   VITAMIN D, CHOLECALCIFEROL, PO Take 10,000 Units by mouth daily.   [DISCONTINUED] 0.9 %  sodium chloride infusion    No facility-administered medications prior to visit.   No Known Allergies  Immunization History   Administered Date(s) Administered   Influenza,inj,Quad PF,6+ Mos 10/09/2015, 08/13/2019, 07/25/2020   Influenza-Unspecified 08/29/2021, 08/29/2022   PFIZER(Purple Top)SARS-COV-2 Vaccination 02/09/2020, 03/04/2020, 09/29/2021, 08/29/2022   Tdap 11/04/2015    Health Maintenance  Topic Date Due   COVID-19 Vaccine (5 - 2023-24 season) 10/24/2022   INFLUENZA VACCINE  06/30/2023   PAP SMEAR-Modifier  11/12/2024   DTaP/Tdap/Td (2 - Td or Tdap) 11/03/2025   Colonoscopy  04/11/2033   Hepatitis C Screening  Completed   HIV Screening  Completed   HPV VACCINES  Aged Out    Patient Care Team: Ronnald Ramp, MD as PCP - General (Family Medicine)  Review of Systems     Objective    BP 121/80 (BP  Location: Left Arm, Patient Position: Sitting, Cuff Size: Normal)   Pulse 68   Ht 4\' 11"  (1.499 m)   Wt 134 lb 9.6 oz (61.1 kg)   BMI 27.19 kg/m    Physical Exam Vitals reviewed.  Constitutional:      General: She is not in acute distress.    Appearance: Normal appearance. She is not ill-appearing, toxic-appearing or diaphoretic.  Eyes:     Conjunctiva/sclera: Conjunctivae normal.  Cardiovascular:     Rate and Rhythm: Normal rate and regular rhythm.     Pulses: Normal pulses.     Heart sounds: Normal heart sounds. No murmur heard.    No friction rub. No gallop.  Pulmonary:     Effort: Pulmonary effort is normal. No respiratory distress.     Breath sounds: Normal breath sounds. No stridor. No wheezing, rhonchi or rales.  Abdominal:     General: Bowel sounds are normal. There is no distension.     Palpations: Abdomen is soft.     Tenderness: There is no abdominal tenderness.  Musculoskeletal:     Right lower leg: No edema.     Left lower leg: No edema.  Skin:    Findings: No erythema or rash.  Neurological:     Mental Status: She is alert and oriented to person, place, and time.      Depression Screen    05/17/2023    8:15 AM 02/21/2023    9:05 AM 02/19/2022     9:55 AM 01/19/2022    2:08 PM  PHQ 2/9 Scores  PHQ - 2 Score 0 0 0 0  PHQ- 9 Score 1 0 0 0   No results found for any visits on 05/17/23.  Assessment & Plan      Problem List Items Addressed This Visit       Digestive   Slow transit constipation    Chronic  Follows with Dr. Leone Payor, GI  Patient will follow up as scheduled  We discussed increasing water intake to help with bowel movements as patients reports little water intake currently  No current medications  Patient states she is not interested in restarting medications including linzess due to hair loss previously         Endocrine   Hypothyroidism    Chronic  Stable  Followed by endocrinology  Continue levothyroxine daily  Follow up as scheduled with Dr. Lafe Garin         Other   Sleep disturbance    Chronic  Reports mind racing most nights  She does not prefer medications  Reports no response to ambien for sleep in the past  We discussed wine down routine including melatonin 1 hour prior to desired bed time, sleeping in a cool dark room, no screens immediately before bed or while sleeping, recommended avoiding alcohol (patient states she is not drinking), and to stop consuming caffeine beverages after 4-5PM  Also recommended [email or web address] to establish with therapist with additional recommendations for improving sleep and managing busy schedule       Establishing care with new doctor, encounter for - Primary    Welcomed patient to [facility]  Reviewed patient's medical history, medications, surgical and social history Discussed roles and expectations for primary care physician-patient relationship Recommended patient schedule annual preventative examinations        Elevated LDL cholesterol level    Chronic  Not at goal  LDL elevated at 128 from labs reviewed in EMR from prior PCP office  Recommended dietary modifications to decrease saturated fat, red meat, and increase physical  activity  Recommended rechecking lipids in Sept 2024 See AVS for dietary handout         Return in about 3 months (around 08/17/2023) for lipids, Vitamin D.       The entirety of the information documented in the History of Present Illness, Review of Systems and Physical Exam were personally obtained by me. Portions of this information were initially documented by Sha'taria Tyson,CMA. I, Ronnald Ramp, MD have reviewed the documentation above for thoroughness and accuracy.      Ronnald Ramp, MD  [facility] [phone] (phone) [phone] (fax)  [facility]

## 2023-05-17 NOTE — Assessment & Plan Note (Addendum)
Chronic  Not at goal  LDL elevated at 128 from labs reviewed in EMR from prior PCP office  Recommended dietary modifications to decrease saturated fat, red meat, and increase physical activity  Recommended rechecking lipids in Sept 2024 See AVS for dietary handout

## 2023-05-17 NOTE — Assessment & Plan Note (Signed)
Welcomed patient to [facility]  Reviewed patient's medical history, medications, surgical and social history Discussed roles and expectations for primary care physician-patient relationship Recommended patient schedule annual preventative examinations   

## 2023-05-21 ENCOUNTER — Other Ambulatory Visit: Payer: Federal, State, Local not specified - PPO

## 2023-06-01 ENCOUNTER — Ambulatory Visit: Payer: Federal, State, Local not specified - PPO | Admitting: Internal Medicine

## 2023-06-20 IMAGING — US US BREAST*R* LIMITED INC AXILLA
1 series · 2 of 2 positions shown · non-contrast
Comparison: Previous exam(s).

CLINICAL DATA: Screening recall for a right breast asymmetry.

EXAM:
DIGITAL DIAGNOSTIC UNILATERAL RIGHT MAMMOGRAM WITH TOMOSYNTHESIS AND
CAD; ULTRASOUND RIGHT BREAST LIMITED
TECHNIQUE: Right digital diagnostic mammography and breast tomosynthesis was
performed. The images were evaluated with computer-aided detection.;
Targeted ultrasound examination of the right breast was performed

[Series 1: us breast*right* limited inc axilla · 0.07mm/px · 2 of 2 slices shown]
[im 1/2]
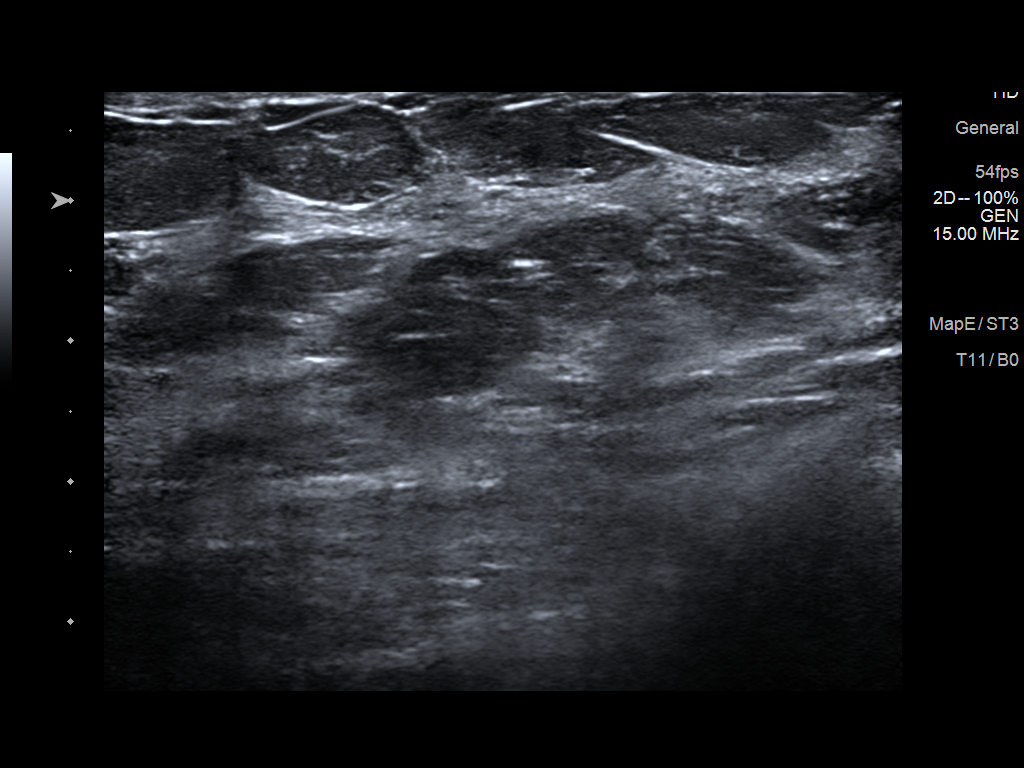
[im 2/2]
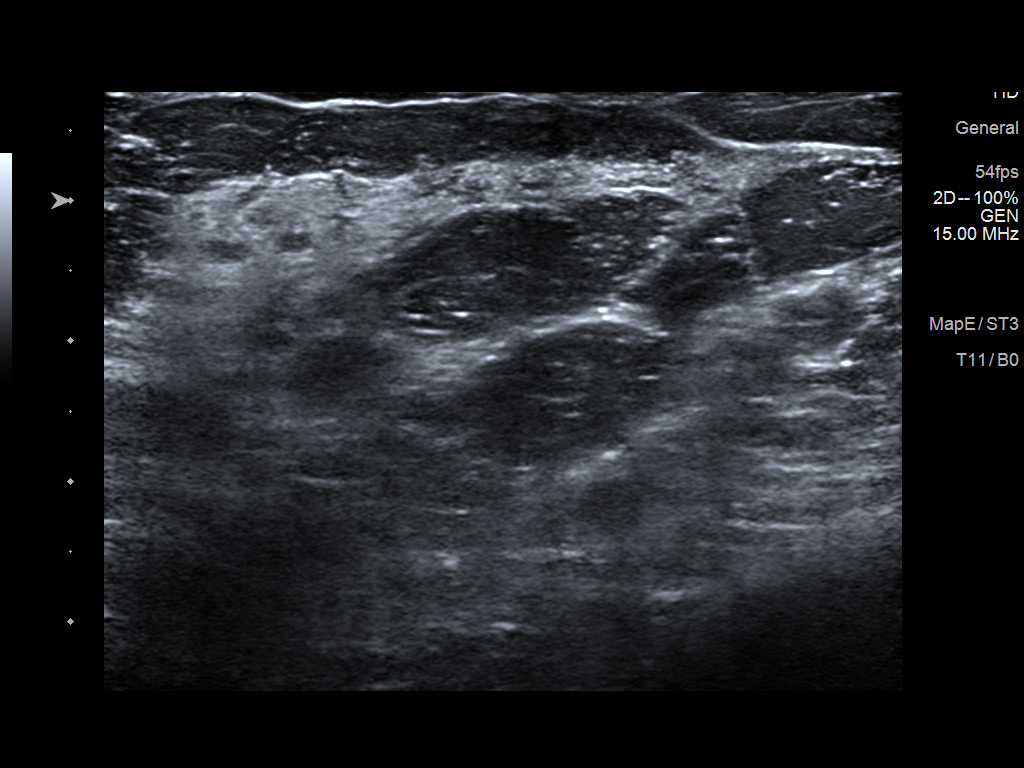

[2 of 2 positions shown; findings below may reference images not displayed]

ACR Breast Density Category c: The breast tissue is heterogeneously
dense, which may obscure small masses.
FINDINGS: There is a persistent small superior asymmetry in the superior
middle depth of the right breast. There is no definite underlying
mass or suspicious appearance of distortion.

Ultrasound targeted to the superior aspect of the right breast
demonstrates normal fibroglandular tissue. No suspicious masses or
areas of shadowing are identified.
IMPRESSION: There is an asymmetry in the superior right breast without a
sonographic correlate. This finding is likely benign.

RECOMMENDATION:
Six-month follow-up right breast mammogram.

I have discussed the findings and recommendations with the patient.
If applicable, a reminder letter will be sent to the patient
regarding the next appointment.

BI-RADS CATEGORY  3: Probably benign.

## 2023-06-20 IMAGING — MG MM DIGITAL DIAGNOSTIC UNILAT*R* W/ TOMO W/ CAD
4 series · 4 of 12 positions shown · non-contrast
Comparison: Previous exam(s).

CLINICAL DATA: Screening recall for a right breast asymmetry.

EXAM:
DIGITAL DIAGNOSTIC UNILATERAL RIGHT MAMMOGRAM WITH TOMOSYNTHESIS AND
CAD; ULTRASOUND RIGHT BREAST LIMITED
TECHNIQUE: Right digital diagnostic mammography and breast tomosynthesis was
performed. The images were evaluated with computer-aided detection.;
Targeted ultrasound examination of the right breast was performed

[R MLO synth-2D]
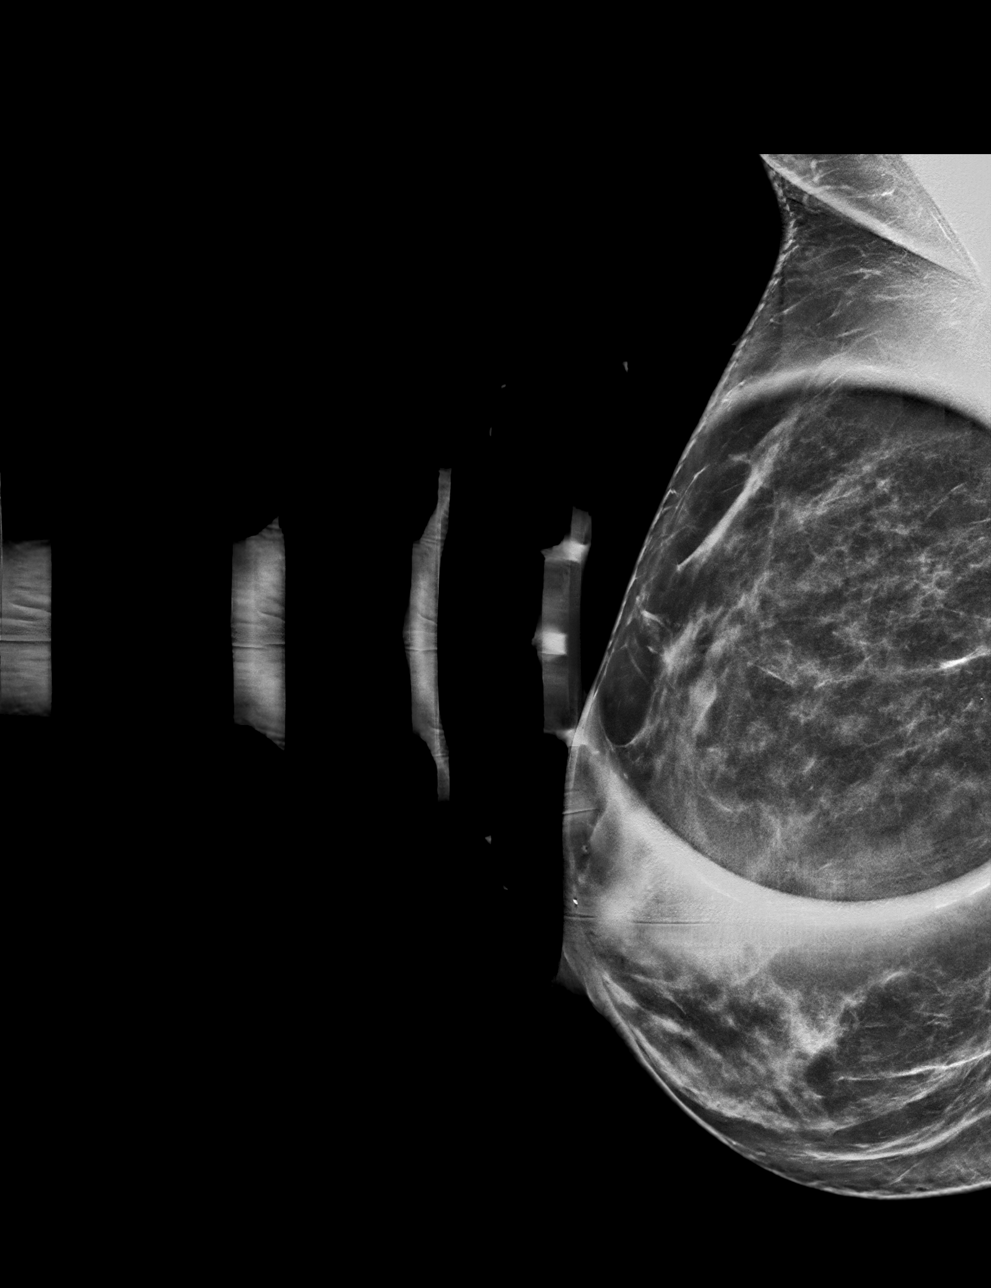

[R ML synth-2D]
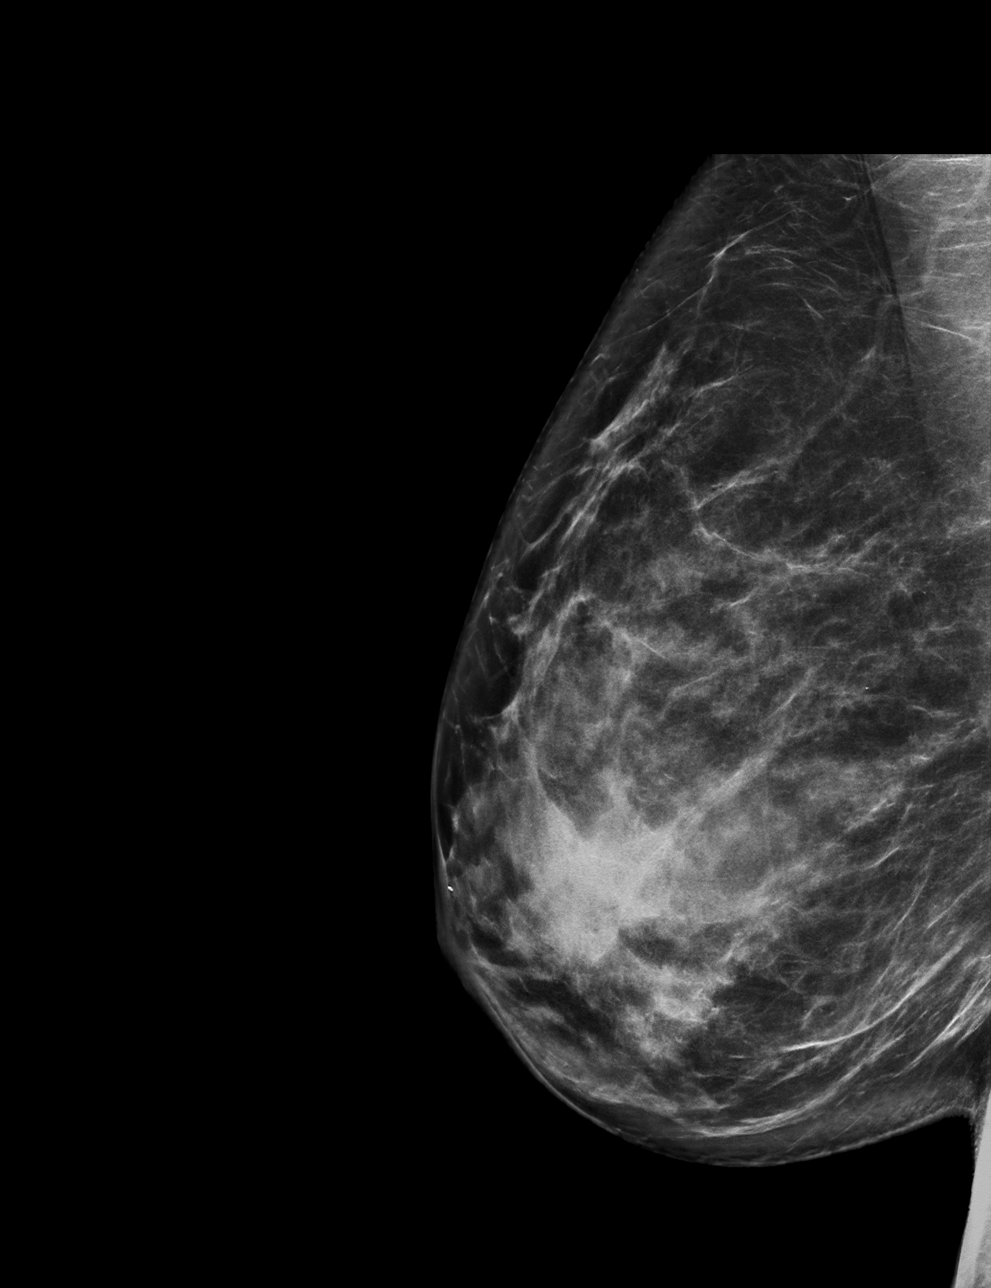

[R ML tomo · tomo slice 39/78.0]
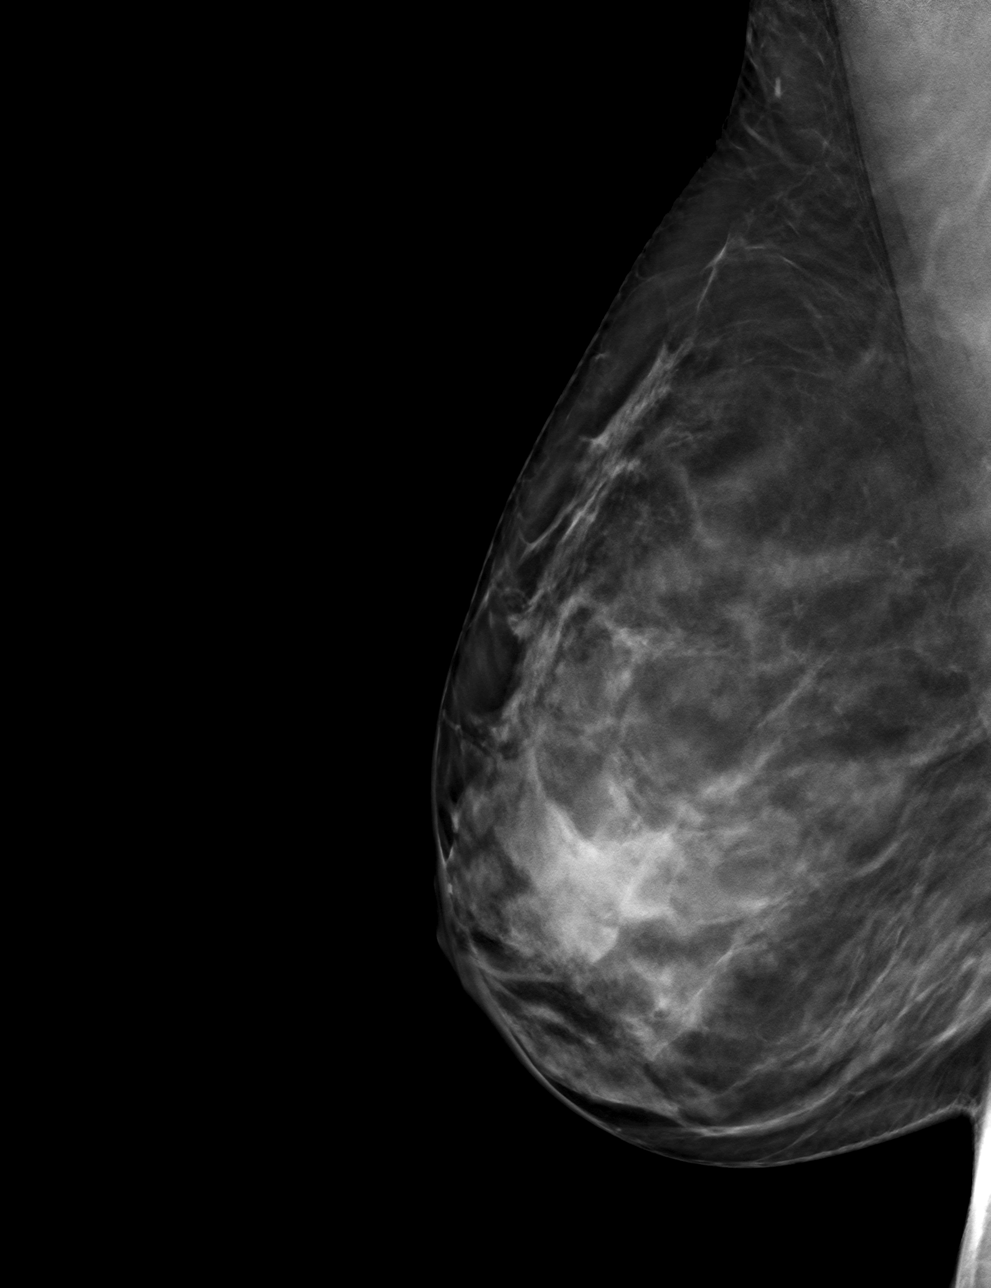

[R MLO tomo · tomo slice 35/70.0]
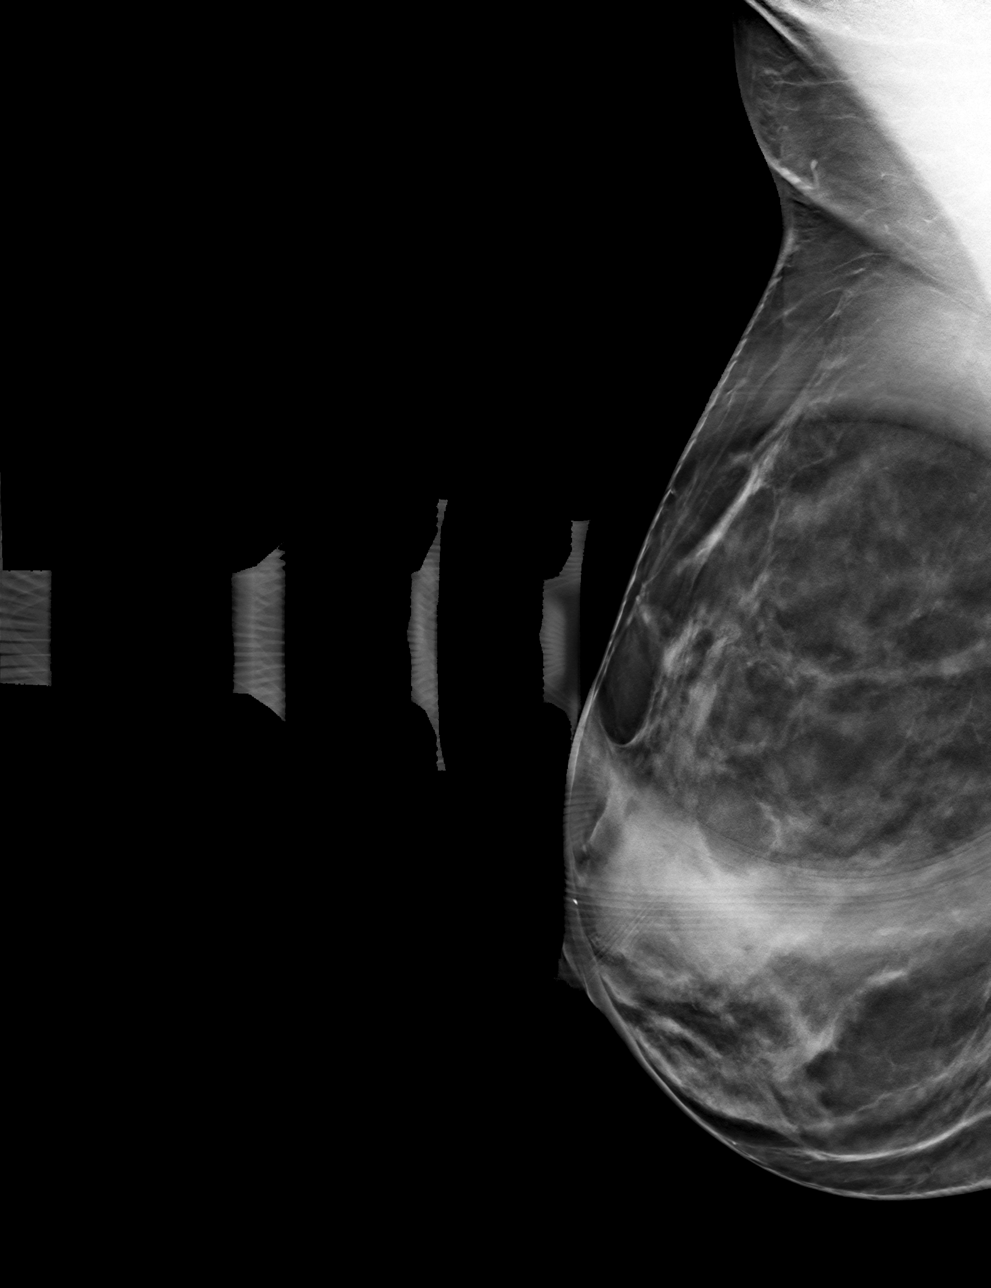

[4 of 12 positions shown; findings below may reference images not displayed]

ACR Breast Density Category c: The breast tissue is heterogeneously
dense, which may obscure small masses.
FINDINGS: There is a persistent small superior asymmetry in the superior
middle depth of the right breast. There is no definite underlying
mass or suspicious appearance of distortion.

Ultrasound targeted to the superior aspect of the right breast
demonstrates normal fibroglandular tissue. No suspicious masses or
areas of shadowing are identified.
IMPRESSION: There is an asymmetry in the superior right breast without a
sonographic correlate. This finding is likely benign.

RECOMMENDATION:
Six-month follow-up right breast mammogram.

I have discussed the findings and recommendations with the patient.
If applicable, a reminder letter will be sent to the patient
regarding the next appointment.

BI-RADS CATEGORY  3: Probably benign.

## 2023-06-27 ENCOUNTER — Other Ambulatory Visit: Payer: Federal, State, Local not specified - PPO

## 2023-07-21 ENCOUNTER — Ambulatory Visit
Admission: RE | Admit: 2023-07-21 | Discharge: 2023-07-21 | Disposition: A | Discharge status: Home / Self Care | Payer: Federal, State, Local not specified - PPO | Source: Ambulatory Visit | Attending: Obstetrics and Gynecology | Admitting: Obstetrics and Gynecology

## 2023-07-21 DIAGNOSIS — Z1239 Encounter for other screening for malignant neoplasm of breast: Secondary | ICD-10-CM | POA: Diagnosis not present

## 2023-07-21 DIAGNOSIS — Z803 Family history of malignant neoplasm of breast: Secondary | ICD-10-CM

## 2023-07-21 MED ORDER — GADOPICLENOL 0.5 MMOL/ML IV SOLN
6.0000 mL | Freq: Once | INTRAVENOUS | Status: AC | PRN
  Administered 2023-07-21: 6 mL via INTRAVENOUS

## 2023-08-15 ENCOUNTER — Telehealth: Payer: Self-pay | Admitting: Family Medicine

## 2023-08-17 ENCOUNTER — Ambulatory Visit: Payer: Federal, State, Local not specified - PPO | Admitting: Family Medicine

## 2023-10-28 DIAGNOSIS — R052 Subacute cough: Secondary | ICD-10-CM | POA: Diagnosis not present

## 2023-11-25 ENCOUNTER — Ambulatory Visit: Payer: Federal, State, Local not specified - PPO | Admitting: Internal Medicine

## 2023-11-25 ENCOUNTER — Encounter: Payer: Self-pay | Admitting: Internal Medicine

## 2023-11-25 VITALS — BP 112/70 | HR 86 | Ht 59.0 in | Wt 139.6 lb

## 2023-11-25 DIAGNOSIS — E039 Hypothyroidism, unspecified: Secondary | ICD-10-CM | POA: Diagnosis not present

## 2023-11-25 NOTE — Patient Instructions (Signed)
Please continue Levothyroxine 50 mcg daily.  Take the thyroid hormone every day, with water, at least 30 minutes before breakfast, separated by at least 4 hours from: - acid reflux medications - calcium - iron - multivitamins  Please stop at the lab.  Please come back for a follow-up appointment in 1 year. 

## 2023-11-25 NOTE — Progress Notes (Signed)
Patient ID: Ann Rogers, female   DOB: [birth year], 47 y.o.   MRN: [id number]  HPI  Ann Rogers is a 47 y.o.-year-old female, returning for f/u for hypothyroidism, dx 07/2013. Last visit 1 year ago. She is here with her daughter Ann Rogers.  Interim history: She still has fatigue and cannot sleep very well. She has IBS with constipation - she did not like Linzess - did not work, had hair loss. On Miralax. She gained 7 lbs since last OV. She is still commuting to work - 1h 30 min.   Reviewed history: Pt. has been dx with hypothyroidism in 07/2013 (TSH high, but pt not told how high, and records not available) >> started on Levothyroxine 25 mcg.  During pregnancy, with increased the dose of her levothyroxine to 75 mcg daily and we decreased it back after the pregnancy to 50 mcg daily.  Pt is on levothyroxine 50 mcg daily, taken: - in am - fasting - at least 30 min from b'fast - no Ca, Fe, PPIs - stopped MVIs at night - + on Biotin - last dose last night - 120 mcg  TFTs were reviewed: Lab Results  Component Value Date   TSH 1.64 11/12/2022   TSH 1.50 11/06/2021   TSH 2.00 09/22/2020   TSH 2.07 09/17/2019   TSH 2.08 09/20/2018   FREET4 1.02 11/12/2022   FREET4 1.13 11/06/2021   FREET4 1.13 09/22/2020   FREET4 0.98 09/17/2019   FREET4 1.07 09/20/2018   Her thyroid antibodies were not elevated:  Thyroid Peroxidase Antibody 10/19/2013 10.6  <35.0 IU/mL Final   Pt denies: - feeling nodules in neck - hoarseness - dysphagia - choking  No FH of thyroid disease or thyroid cancer. No h/o radiation tx to head or neck. No herbal supplements. No Biotin use. No recent steroids use.   She has a h/o vitamin D deficiency -on 10,000 units vitamin D daily - but forgetting doses. She previously lost weight on the [employer] diet -stopped 06/2020.  ROS: + see HPI  I reviewed pt's medications, allergies, PMH, social hx, family hx, and changes were documented in the history of  present illness. Otherwise, unchanged from my initial visit note.  Past Medical History:  Diagnosis Date   AMA (advanced maternal age) primigravida 35+    Hypothyroidism    IBS (irritable bowel syndrome)    Slow transit constipation 06/06/2014   She has MiraLax it sounds like, she responds to intermittent Dulcolax, Linzess 290 mcg not helpful    Past Surgical History:  Procedure Laterality Date   ANAL RECTAL MANOMETRY N/A 07/01/2014   Procedure: ANAL RECTAL MANOMETRY;  Surgeon: Iva Boop, MD;  Location: [facility];  Service: Endoscopy;  Laterality: N/A;   COLONOSCOPY     egg preservation     LASIK  11/30/2011   Social History   Socioeconomic History   Marital status: Single    Spouse name: Not on file   Number of children: 0   Years of education: Not on file   Highest education level: Master's degree (e.g., MA, MS, MEng, MEd, MSW, MBA)  Occupational History   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[employer]  Tobacco Use   Smoking status: Never   Smokeless tobacco: Never  Vaping Use   Vaping status: Never Used  Substance and Sexual Activity   Alcohol use: No   Drug use: No   Sexual activity: Not Currently  Other Topics Concern  Not on file  Social History Narrative   One child, lives in [place] Asst Advice worker AirportRegular exercise: noCaffeine use: coffee in the am; 3-4 sodas daily   Social Drivers of Corporate investment banker Strain: Low Risk  (05/16/2023)   Overall Financial Resource Strain (CARDIA)    Difficulty of Paying Living Expenses: Not hard at all  Food Insecurity: No Food Insecurity (05/16/2023)   Hunger Vital Sign    Worried About Running Out of Food in the Last Year: Never true    Ran Out of Food in the Last Year: Never true  Transportation Needs: No Transportation Needs (05/16/2023)   PRAPARE - Administrator, Civil Service (Medical): No    Lack of  Transportation (Non-Medical): No  Physical Activity: Insufficiently Active (05/16/2023)   Exercise Vital Sign    Days of Exercise per Week: 3 days    Minutes of Exercise per Session: 30 min  Stress: No Stress Concern Present (05/16/2023)   [employer] of Occupational Health - Occupational Stress Questionnaire    Feeling of Stress : Only a little  Social Connections: Moderately Integrated (05/16/2023)   Social Connection and Isolation Panel [NHANES]    Frequency of Communication with Friends and Family: Three times a week    Frequency of Social Gatherings with Friends and Family: Three times a week    Attends Religious Services: More than 4 times per year    Active Member of Clubs or Organizations: Yes    Attends Banker Meetings: 1 to 4 times per year    Marital Status: Never married  Intimate Partner Violence: Not on file   Current Outpatient Medications on File Prior to Visit  Medication Sig Dispense Refill   levothyroxine (SYNTHROID) 50 MCG tablet Take 1 tablet (50 mcg total) by mouth daily. 90 tablet 3   VITAMIN D, CHOLECALCIFEROL, PO Take 10,000 Units by mouth daily.     No current facility-administered medications on file prior to visit.   No Known Allergies Family History  Problem Relation Age of Onset   Stroke Mother 60   Hypertension Father    Hyperlipidemia Father    Breast cancer Maternal Aunt 72   Hypertension Maternal Aunt    Hyperlipidemia Maternal Aunt    Lung cancer Maternal Grandfather 53 - 36   Miscarriages / [place] Cousin    Colon cancer Neg Hx    Colon polyps Neg Hx    Esophageal cancer Neg Hx    Kidney disease Neg Hx    Rectal cancer Neg Hx    Stomach cancer Neg Hx    PE: BP 112/70   Pulse 86   Ht 4\' 11"  (1.499 m)   Wt 139 lb 9.6 oz (63.3 kg)   SpO2 99%   BMI 28.20 kg/m   Wt Readings from Last 10 Encounters:  11/25/23 139 lb 9.6 oz (63.3 kg)  05/17/23 134 lb 9.6 oz (61.1 kg)  04/12/23 128 lb (58.1 kg)  03/16/23 128 lb  (58.1 kg)  02/21/23 136 lb (61.7 kg)  11/12/22 132 lb 3.2 oz (60 kg)  02/19/22 129 lb (58.5 kg)  01/19/22 127 lb (57.6 kg)  11/06/21 127 lb (57.6 kg)  10/22/20 129 lb (58.5 kg)   Constitutional: normal weight, in NAD Eyes:  EOMI, no exophthalmos ENT: no neck masses, no cervical lymphadenopathy Cardiovascular: RRR, No MRG Respiratory: CTA B Musculoskeletal: no deformities Skin:no rashes Neurological: no tremor with outstretched hands  ASSESSMENT: 1. Hypothyroidism  PLAN:  1. Patient with controlled hypothyroidism, on low-dose levothyroxine - latest thyroid labs reviewed with pt. >> normal: Lab Results  Component Value Date   TSH 1.64 11/12/2022  - she continues on LT4 50 mcg daily - pt feels good on this dose.  At last visit she had more fatigue as she was grieving for her father and could not sleep.  This improved.  She does have IBS with constipation, which is not new.  She is using MiraLAX for now as she felt that Linzess was not helping much. - we discussed about taking the thyroid hormone every day, with water, >30 minutes before breakfast, separated by >4 hours from acid reflux medications, calcium, iron, multivitamins. Pt. is taking it correctly. - will check thyroid tests today: TSH and fT4 - If labs are abnormal, she will need to return for repeat TFTs in 1.5 months - OTW, I will see her back in a year  She needs refills.  Orders Placed This Encounter  Procedures   TSH   T4, free   Carlus Pavlov, MD PhD [facility]

## 2023-11-26 LAB — T3, FREE: T3, Free: 3.1 pg/mL (ref 2.3–4.2)

## 2023-11-26 LAB — TSH: TSH: 2.7 m[IU]/L

## 2023-11-26 LAB — T4, FREE: Free T4: 1.3 ng/dL (ref 0.8–1.8)

## 2023-11-28 MED ORDER — LEVOTHYROXINE SODIUM 50 MCG PO TABS: 50.0000 ug | ORAL_TABLET | Freq: Every day | ORAL | 3 refills | Status: DC

## 2023-11-28 NOTE — Addendum Note (Signed)
Addended by: Carlus Pavlov on: 11/28/2023 09:55 AM   Modules accepted: Orders

## 2024-01-03 ENCOUNTER — Other Ambulatory Visit: Payer: Self-pay | Admitting: Obstetrics and Gynecology

## 2024-01-03 DIAGNOSIS — N6489 Other specified disorders of breast: Secondary | ICD-10-CM

## 2024-01-05 ENCOUNTER — Encounter: Payer: Self-pay | Admitting: Family Medicine

## 2024-01-05 ENCOUNTER — Ambulatory Visit: Payer: Federal, State, Local not specified - PPO | Admitting: Family Medicine

## 2024-01-05 VITALS — BP 119/74 | HR 88 | Ht 59.0 in | Wt 139.0 lb

## 2024-01-05 DIAGNOSIS — F430 Acute stress reaction: Secondary | ICD-10-CM

## 2024-01-05 DIAGNOSIS — E039 Hypothyroidism, unspecified: Secondary | ICD-10-CM

## 2024-01-05 DIAGNOSIS — E7800 Pure hypercholesterolemia, unspecified: Secondary | ICD-10-CM | POA: Diagnosis not present

## 2024-01-05 DIAGNOSIS — R002 Palpitations: Secondary | ICD-10-CM

## 2024-01-05 DIAGNOSIS — F410 Panic disorder [episodic paroxysmal anxiety] without agoraphobia: Secondary | ICD-10-CM

## 2024-01-05 DIAGNOSIS — F419 Anxiety disorder, unspecified: Secondary | ICD-10-CM

## 2024-01-05 NOTE — Progress Notes (Signed)
 Established patient visit   Patient: Ann Rogers   DOB: 1976/01/28   47 y.o. Female  MRN: [id number] Visit Date: 01/05/2024  Today's healthcare provider: Rockie Agent, MD   Chief Complaint  Patient presents with   Hyperlipidemia   Anxiety    Wants FMLA forms filled out   Subjective     HPI     Anxiety    Additional comments: Wants FMLA forms filled out      Last edited by Sydney Clotilda HERO, CMA on 01/05/2024  3:10 PM.       Discussed the use of AI scribe software for clinical note transcription with the patient, who gave verbal consent to proceed.  History of Present Illness   Ann Rogers is a 48 year old female who presents for FMLA follow-up for anxiety.  Her anxiety has been exacerbated by changes in her work schedule due to the cessation of telework, increasing her stress levels. As a single mother, she relies on a flexible schedule to manage her responsibilities, including getting her daughter to school. Her anxiety manifests as chronic racing thoughts at night, heart palpitations, lightheadedness, dizziness, crying, and irritability. She feels bleak but has no thoughts of self-harm.  She is not currently on any medication for anxiety. She previously tried Ambien  for sleep but found it ineffective. She has been referred to a therapist but has not yet established care.  Her past medical history includes hyperlipidemia and hypothyroidism. She is currently taking Synthroid  50 mcg daily for her hypothyroidism and follows up with endocrinology. Her LDL was previously noted to be elevated at 128 mg/dL. Her blood pressure is well-controlled at 119/74 mmHg, and she is not on any antihypertensive medications.  She is a single mother and works for [employer]. She has been managing her work schedule to accommodate her daughter's school schedule, which has been disrupted by recent changes in telework policy.       Past Medical History:   Diagnosis Date   AMA (advanced maternal age) primigravida 35+    Hypothyroidism    IBS (irritable bowel syndrome)    Slow transit constipation 06/06/2014   She has MiraLax it sounds like, she responds to intermittent Dulcolax, Linzess  290 mcg not helpful     Medications: Outpatient Medications Prior to Visit  Medication Sig   levothyroxine  (SYNTHROID ) 50 MCG tablet Take 1 tablet (50 mcg total) by mouth daily.   VITAMIN D , CHOLECALCIFEROL, PO Take 10,000 Units by mouth daily.   No facility-administered medications prior to visit.    Review of Systems  Last CBC Lab Results  Component Value Date   WBC 7.2 02/21/2023   HGB 12.9 02/21/2023   HCT 39.5 02/21/2023   MCV 88 02/21/2023   MCH 28.9 02/21/2023   RDW 12.1 02/21/2023   PLT 249 02/21/2023   Last metabolic panel Lab Results  Component Value Date   GLUCOSE 86 02/21/2023   NA 140 02/21/2023   K 4.6 02/21/2023   CL 106 02/21/2023   CO2 22 02/21/2023   BUN 16 02/21/2023   CREATININE 0.80 02/21/2023   EGFR 93 02/21/2023   CALCIUM 9.0 02/21/2023   PHOS 3.2 02/19/2022   PROT 7.2 02/21/2023   ALBUMIN 4.2 02/21/2023   LABGLOB 3.0 02/21/2023   AGRATIO 1.4 02/21/2023   BILITOT 0.2 02/21/2023   ALKPHOS 53 02/21/2023   AST 18 02/21/2023   ALT 19 02/21/2023   Last lipids Lab Results  Component Value Date  CHOL 197 02/21/2023   HDL 63 02/21/2023   LDLCALC 128 (H) 02/21/2023   TRIG 33 02/21/2023   CHOLHDL 3.0 10/22/2020   Last hemoglobin A1c No results found for: HGBA1C Last thyroid  functions Lab Results  Component Value Date   TSH 2.70 11/25/2023        Objective    BP 119/74   Pulse 88   Ht 4' 11 (1.499 m)   Wt 139 lb (63 kg)   LMP 12/30/2023   BMI 28.07 kg/m  BP Readings from Last 3 Encounters:  01/05/24 119/74  11/25/23 112/70  05/17/23 121/80   Wt Readings from Last 3 Encounters:  01/05/24 139 lb (63 kg)  11/25/23 139 lb 9.6 oz (63.3 kg)  05/17/23 134 lb 9.6 oz (61.1 kg)         Physical Exam Vitals reviewed.  Constitutional:      General: She is not in acute distress.    Appearance: Normal appearance. She is not ill-appearing, toxic-appearing or diaphoretic.  Eyes:     Conjunctiva/sclera: Conjunctivae normal.  Cardiovascular:     Rate and Rhythm: Normal rate and regular rhythm.     Pulses: Normal pulses.     Heart sounds: Normal heart sounds. No murmur heard.    No friction rub. No gallop.  Pulmonary:     Effort: Pulmonary effort is normal. No respiratory distress.     Breath sounds: Normal breath sounds. No stridor. No wheezing, rhonchi or rales.  Abdominal:     General: Bowel sounds are normal. There is no distension.     Palpations: Abdomen is soft.     Tenderness: There is no abdominal tenderness.  Musculoskeletal:     Right lower leg: No edema.     Left lower leg: No edema.  Skin:    Findings: No erythema or rash.  Neurological:     Mental Status: She is alert and oriented to person, place, and time.  Psychiatric:        Attention and Perception: Attention normal. She is attentive.        Mood and Affect: Mood is anxious. Affect is tearful.        Speech: Speech normal.        Behavior: Behavior normal. Behavior is cooperative.        Thought Content: Thought content does not include suicidal ideation. Thought content does not include suicidal plan.       No results found for any visits on 01/05/24.  Assessment & Plan     Problem List Items Addressed This Visit       Endocrine   Hypothyroidism   Managed by endocrinology with Synthroid  50 mcg daily. Recent follow-up with endocrinology was satisfactory. Chronic - Continue Synthroid  50 mcg daily - Follow-up with endocrinology        Other   Panic attack as reaction to stress - Primary   Chronic anxiety exacerbated by work-related stress and changes in telework policy. Symptoms include racing thoughts, insomnia, heart palpitations, lightheadedness, dizziness, crying, and  irritability. Previous treatment with Ambien  was unsuccessful. She prefers workplace accommodation over medication for anxiety management. Discussed sleep hygiene and referred [facility] to [email or web address] for therapy. She prefers to wait for a referral to connect with a therapist. - Provide medical documentation for reasonable accommodation at work, paperwork in my box, will complete within next 7-10 business days, patient made aware and agreed with policy  - Refer to therapist via referral psychiatry  - Follow-up in 3 months  Relevant Orders   Ambulatory referral to Psychiatry   Elevated LDL cholesterol level   Elevated LDL at 128 mg/dL noted in previous labs. Due for a recheck of lipid panel. Discussed the importance of regular exercise and stress management to control lipid levels. - Order lipid panel - Schedule fasting blood draw in the morning - no current medications       Relevant Orders   Lipid Profile   Other Visit Diagnoses       Anxiety         Palpitations              General Health Maintenance Blood pressure well-controlled at 119/74 mmHg without medication. Discussed the importance of regular exercise and stress management. - Encourage regular exercise - Encourage stress management techniques    Return in about 3 months (around 04/03/2024) for Anxiety.       Rockie Agent, MD  [facility] [phone] (phone) [phone] (fax)  [facility]

## 2024-01-05 NOTE — Assessment & Plan Note (Signed)
 Elevated LDL at 128 mg/dL noted in previous labs. Due for a recheck of lipid panel. Discussed the importance of regular exercise and stress management to control lipid levels. - Order lipid panel - Schedule fasting blood draw in the morning - no current medications

## 2024-01-05 NOTE — Assessment & Plan Note (Signed)
 Managed by endocrinology with Synthroid  50 mcg daily. Recent follow-up with endocrinology was satisfactory. Chronic - Continue Synthroid  50 mcg daily - Follow-up with endocrinology

## 2024-01-05 NOTE — Assessment & Plan Note (Signed)
 Chronic anxiety exacerbated by work-related stress and changes in telework policy. Symptoms include racing thoughts, insomnia, heart palpitations, lightheadedness, dizziness, crying, and irritability. Previous treatment with Ambien  was unsuccessful. She prefers workplace accommodation over medication for anxiety management. Discussed sleep hygiene and referred her to [email or web address] for therapy. She prefers to wait for a referral to connect with a therapist. - Provide medical documentation for reasonable accommodation at work, paperwork in my box, will complete within next 7-10 business days, patient made aware and agreed with policy  - Refer to therapist via referral psychiatry  - Follow-up in 3 months

## 2024-01-09 ENCOUNTER — Telehealth: Payer: Self-pay | Admitting: Family Medicine

## 2024-01-09 NOTE — Telephone Encounter (Signed)
 Completed FMLA forms are in my box in [address] in sky blue folder

## 2024-01-09 NOTE — Telephone Encounter (Signed)
 Please call patient to pick up completed forms and have lipids drawn.

## 2024-01-10 NOTE — Telephone Encounter (Signed)
Called patient to let her know that paperwork has been completed for FMLA and at [address] and for lipid lab test to be completed as well. Patient stated she will be in Friday 2/14 VM

## 2024-01-13 DIAGNOSIS — E7800 Pure hypercholesterolemia, unspecified: Secondary | ICD-10-CM | POA: Diagnosis not present

## 2024-01-14 LAB — LIPID PANEL
Chol/HDL Ratio: 2.6 {ratio} (ref 0.0–4.4)
Cholesterol, Total: 169 mg/dL (ref 100–199)
HDL: 66 mg/dL (ref >39)
LDL Chol Calc (NIH): 95 mg/dL (ref 0–99)
Triglycerides: 39 mg/dL (ref 0–149)
VLDL Cholesterol Cal: 8 mg/dL (ref 5–40)

## 2024-01-16 ENCOUNTER — Encounter: Payer: Self-pay | Admitting: Family Medicine

## 2024-01-24 ENCOUNTER — Ambulatory Visit
Admission: RE | Admit: 2024-01-24 | Discharge: 2024-01-24 | Disposition: A | Discharge status: Home / Self Care | Payer: Federal, State, Local not specified - PPO | Source: Ambulatory Visit | Attending: Obstetrics and Gynecology | Admitting: Obstetrics and Gynecology

## 2024-01-24 DIAGNOSIS — R928 Other abnormal and inconclusive findings on diagnostic imaging of breast: Secondary | ICD-10-CM | POA: Diagnosis not present

## 2024-01-24 DIAGNOSIS — N6489 Other specified disorders of breast: Secondary | ICD-10-CM

## 2024-01-29 DIAGNOSIS — J101 Influenza due to other identified influenza virus with other respiratory manifestations: Secondary | ICD-10-CM | POA: Diagnosis not present

## 2024-01-29 DIAGNOSIS — R051 Acute cough: Secondary | ICD-10-CM | POA: Diagnosis not present

## 2024-01-29 DIAGNOSIS — Z20822 Contact with and (suspected) exposure to covid-19: Secondary | ICD-10-CM | POA: Diagnosis not present

## 2024-01-29 DIAGNOSIS — J40 Bronchitis, not specified as acute or chronic: Secondary | ICD-10-CM | POA: Diagnosis not present

## 2024-02-24 ENCOUNTER — Encounter: Payer: Self-pay | Admitting: Family Medicine

## 2024-02-28 DIAGNOSIS — F4322 Adjustment disorder with anxiety: Secondary | ICD-10-CM | POA: Diagnosis not present

## 2024-03-18 DIAGNOSIS — F4322 Adjustment disorder with anxiety: Secondary | ICD-10-CM | POA: Diagnosis not present

## 2024-04-03 ENCOUNTER — Encounter: Payer: Self-pay | Admitting: Family Medicine

## 2024-04-03 ENCOUNTER — Ambulatory Visit: Payer: Federal, State, Local not specified - PPO | Admitting: Family Medicine

## 2024-04-03 VITALS — BP 115/81 | HR 75 | Ht 59.0 in | Wt 137.0 lb

## 2024-04-03 DIAGNOSIS — L659 Nonscarring hair loss, unspecified: Secondary | ICD-10-CM

## 2024-04-03 DIAGNOSIS — F410 Panic disorder [episodic paroxysmal anxiety] without agoraphobia: Secondary | ICD-10-CM

## 2024-04-03 DIAGNOSIS — E039 Hypothyroidism, unspecified: Secondary | ICD-10-CM | POA: Diagnosis not present

## 2024-04-03 DIAGNOSIS — F4322 Adjustment disorder with anxiety: Secondary | ICD-10-CM | POA: Diagnosis not present

## 2024-04-03 DIAGNOSIS — F430 Acute stress reaction: Secondary | ICD-10-CM

## 2024-04-03 NOTE — Assessment & Plan Note (Signed)
 Chronic  Managed with Synthroid . Reports a change in medication appearance, potentially affecting symptoms. Experiencing hair thinning, dry hair, and memory issues, possibly related to thyroid  function or perimenopausal changes. Differential diagnosis includes medication change effects or perimenopausal symptoms. - Order TSH, T4, T3 tests to evaluate thyroid  function - Consider reintroducing a multivitamin or prenatal vitamin

## 2024-04-03 NOTE — Assessment & Plan Note (Signed)
 Anxiety disorder, chronic stable  Exacerbated by workplace stress and job security concerns. Improvement in symptoms due to support from other parents in managing childcare responsibilities. No recent panic attacks. Monthly psychiatric appointments have been beneficial. - Continue monthly psychiatric appointments - no new medications added

## 2024-04-03 NOTE — Progress Notes (Signed)
 Established patient visit   Patient: Ann Rogers   DOB: 1976/06/06   48 y.o. Female  MRN: [id number] Visit Date: 04/03/2024  Today's healthcare provider: Mimi Alt, MD   Chief Complaint  Patient presents with   Anxiety    No concerns    Subjective     HPI     Anxiety    Additional comments: No concerns       Last edited by Bart Lieu, CMA on 04/03/2024  8:20 AM.       Discussed the use of AI scribe software for clinical note transcription with the patient, who gave verbal consent to proceed.  History of Present Illness Ann Rogers is a 48 year old female who presents for follow-up of anxiety and panic attacks.  She experiences ongoing anxiety and panic attacks, primarily triggered by workplace stressors. Her work environment is challenging, with issues involving her boss and colleagues, leading to significant anxiety. She has filed an EEO complaint due to perceived discrimination and feels validated by her staff's observations of her boss's behavior. Despite these challenges, recent panic attacks have not been as severe, which she attributes to support from other mothers in her community.  She is a single parent and relies on a network of mothers to help with her daughter's school transportation, which has alleviated some stress. She fears potential job loss due to organizational changes and the lack of a backup plan, contributing to her anxiety. She has not been actively looking for other employment due to the current stress at work.  She is currently taking Synthroid  for her thyroid  condition and notes a change in the medication's appearance from oblong to circular, expressing concern about potential differences in efficacy. She reports symptoms that may be related to her thyroid  condition or perimenopause, including a small bald spot, hair dryness, and forgetfulness. She plans to consult her OBGYN regarding these symptoms.  She has  been seeing a psychiatrist once a month, who has been flexible with scheduling to accommodate her work situation. This support has been beneficial in managing her anxiety.     Past Medical History:  Diagnosis Date   AMA (advanced maternal age) primigravida 35+    Hypothyroidism    IBS (irritable bowel syndrome)    Slow transit constipation 06/06/2014   She has MiraLax it sounds like, she responds to intermittent Dulcolax, Linzess  290 mcg not helpful     Medications: Outpatient Medications Prior to Visit  Medication Sig   levothyroxine  (SYNTHROID ) 50 MCG tablet Take 1 tablet (50 mcg total) by mouth daily.   VITAMIN D , CHOLECALCIFEROL, PO Take 10,000 Units by mouth daily.   No facility-administered medications prior to visit.    Review of Systems  Last metabolic panel Lab Results  Component Value Date   GLUCOSE 86 02/21/2023   NA 140 02/21/2023   K 4.6 02/21/2023   CL 106 02/21/2023   CO2 22 02/21/2023   BUN 16 02/21/2023   CREATININE 0.80 02/21/2023   EGFR 93 02/21/2023   CALCIUM 9.0 02/21/2023   PHOS 3.2 02/19/2022   PROT 7.2 02/21/2023   ALBUMIN 4.2 02/21/2023   LABGLOB 3.0 02/21/2023   AGRATIO 1.4 02/21/2023   BILITOT 0.2 02/21/2023   ALKPHOS 53 02/21/2023   AST 18 02/21/2023   ALT 19 02/21/2023   Last thyroid  functions Lab Results  Component Value Date   TSH 2.70 11/25/2023        Objective    BP  115/81   Pulse 75   Ht 4\' 11"  (1.499 m)   Wt 137 lb (62.1 kg)   SpO2 100%   BMI 27.67 kg/m  BP Readings from Last 3 Encounters:  04/03/24 115/81  01/05/24 119/74  11/25/23 112/70   Wt Readings from Last 3 Encounters:  04/03/24 137 lb (62.1 kg)  01/05/24 139 lb (63 kg)  11/25/23 139 lb 9.6 oz (63.3 kg)        Physical Exam Constitutional:      General: She is not in acute distress.    Appearance: Normal appearance. She is normal weight. She is not ill-appearing, toxic-appearing or diaphoretic.     Comments: Well groomed, calmly sitting in exam  rooom  Neurological:     Mental Status: She is alert.  Psychiatric:        Attention and Perception: Attention and perception normal. She is attentive. She does not perceive auditory or visual hallucinations.        Mood and Affect: Mood normal. Affect is tearful.        Speech: Speech normal.        Behavior: Behavior normal. Behavior is cooperative.        Thought Content: Thought content normal. Thought content is not paranoid or delusional. Thought content does not include homicidal or suicidal ideation. Thought content does not include homicidal or suicidal plan.        Judgment: Judgment normal.       No results found for any visits on 04/03/24.  Assessment & Plan     Problem List Items Addressed This Visit       Endocrine   Hypothyroidism   Chronic  Managed with Synthroid . Reports a change in medication appearance, potentially affecting symptoms. Experiencing hair thinning, dry hair, and memory issues, possibly related to thyroid  function or perimenopausal changes. Differential diagnosis includes medication change effects or perimenopausal symptoms. - Order TSH, T4, T3 tests to evaluate thyroid  function - Consider reintroducing a multivitamin or prenatal vitamin      Relevant Orders   TSH+T4F+T3Free     Other   Panic attack as reaction to stress - Primary   Anxiety disorder, chronic stable  Exacerbated by workplace stress and job security concerns. Improvement in symptoms due to support from other parents in managing childcare responsibilities. No recent panic attacks. Monthly psychiatric appointments have been beneficial. - Continue monthly psychiatric appointments - no new medications added       Other Visit Diagnoses       Adjustment disorder with anxious mood         Thinning hair       Relevant Orders   TSH+T4F+T3Free      Assessment & Plan   Panic attacks No recent episodes. Improvement attributed to reduced stress from reliable childcare support and  ongoing psychiatric care.      Return in about 3 months (around 07/04/2024) for CPE.         Mimi Alt, MD  [facility] [phone] (phone) [phone] (fax)  [facility]

## 2024-04-04 ENCOUNTER — Encounter: Payer: Self-pay | Admitting: Family Medicine

## 2024-04-04 LAB — TSH+T4F+T3FREE
Free T4: 1.45 ng/dL (ref 0.82–1.77)
T3, Free: 2.9 pg/mL (ref 2.0–4.4)
TSH: 1.81 u[IU]/mL (ref 0.450–4.500)

## 2024-04-10 ENCOUNTER — Ambulatory Visit: Payer: Self-pay | Admitting: Family Medicine

## 2024-04-11 ENCOUNTER — Telehealth (INDEPENDENT_AMBULATORY_CARE_PROVIDER_SITE_OTHER): Admitting: Family Medicine

## 2024-04-11 ENCOUNTER — Encounter: Payer: Self-pay | Admitting: Family Medicine

## 2024-04-11 DIAGNOSIS — E663 Overweight: Secondary | ICD-10-CM

## 2024-04-11 DIAGNOSIS — Z7689 Persons encountering health services in other specified circumstances: Secondary | ICD-10-CM | POA: Diagnosis not present

## 2024-04-11 NOTE — Telephone Encounter (Signed)
 Pt called in and appt was scheduled for today at 11am for a mychart visit

## 2024-04-11 NOTE — Progress Notes (Signed)
 MyChart Video Visit    Virtual Visit via Video Note   This format is felt to be most appropriate for this patient at this time. Physical exam was limited by quality of the video and audio technology used for the visit.   Patient location: Patient's home address   Provider location: [facility]  [address]  [place], [place] [address]   I discussed the limitations of evaluation and management by telemedicine and the availability of in person appointments. The patient expressed understanding and agreed to proceed.  Patient: Ann Rogers   DOB: Oct 27, 1976   48 y.o. Female  MRN: [id number] Visit Date: 04/11/2024  Today's healthcare provider: Mimi Alt, MD   No chief complaint on file.  Subjective    HPI   Discussed the use of AI scribe software for clinical note transcription with the patient, who gave verbal consent to proceed.  History of Present Illness LORRAINA Rogers is a 48 year old female who presents for weight management.  She is considering starting weight loss medication with an outside clinic and needs to obtain lab work, including a complete metabolic panel and an A1c. Her weight at the last visit on May 6th was 137 pounds with a BMI of 27. She is concerned about not fitting into her clothes and is seeking to lose weight, potentially through medication.  She recently had her thyroid  levels checked, which were within normal limits, and she continues to take Synthroid  for her hypothyroidism. There is no family history of thyroid  cancer. She has no known issues with blood sugar and has provided recent lab results to the outside clinic, except for the requested complete metabolic panel and A1c.  She is aware of potential side effects of the weight loss medication, such as nausea, constipation, or diarrhea.      Past Medical History:  Diagnosis Date   AMA (advanced maternal age) primigravida 35+     Hypothyroidism    IBS (irritable bowel syndrome)    Slow transit constipation 06/06/2014   She has MiraLax it sounds like, she responds to intermittent Dulcolax, Linzess  290 mcg not helpful     Medications: Outpatient Medications Prior to Visit  Medication Sig   levothyroxine  (SYNTHROID ) 50 MCG tablet Take 1 tablet (50 mcg total) by mouth daily.   VITAMIN D , CHOLECALCIFEROL, PO Take 10,000 Units by mouth daily.   No facility-administered medications prior to visit.    Review of Systems  Last metabolic panel Lab Results  Component Value Date   GLUCOSE 86 02/21/2023   NA 140 02/21/2023   K 4.6 02/21/2023   CL 106 02/21/2023   CO2 22 02/21/2023   BUN 16 02/21/2023   CREATININE 0.80 02/21/2023   EGFR 93 02/21/2023   CALCIUM 9.0 02/21/2023   PHOS 3.2 02/19/2022   PROT 7.2 02/21/2023   ALBUMIN 4.2 02/21/2023   LABGLOB 3.0 02/21/2023   AGRATIO 1.4 02/21/2023   BILITOT 0.2 02/21/2023   ALKPHOS 53 02/21/2023   AST 18 02/21/2023   ALT 19 02/21/2023   Last lipids Lab Results  Component Value Date   CHOL 169 01/13/2024   HDL 66 01/13/2024   LDLCALC 95 01/13/2024   TRIG 39 01/13/2024   CHOLHDL 2.6 01/13/2024   The ASCVD Risk score (Arnett DK, et al., 2019) failed to calculate for the following reasons:   Unable to determine if patient is Non-Hispanic African American  Last hemoglobin A1c No results found for: "HGBA1C" Last thyroid   functions Lab Results  Component Value Date   TSH 1.810 04/03/2024   Last vitamin D  Lab Results  Component Value Date   VD25OH 46.2 02/19/2022   Last vitamin B12 and Folate No results found for: "VITAMINB12", "FOLATE"      Objective    There were no vitals taken for this visit.  BP Readings from Last 3 Encounters:  04/03/24 115/81  01/05/24 119/74  11/25/23 112/70   Wt Readings from Last 3 Encounters:  04/03/24 137 lb (62.1 kg)  01/05/24 139 lb (63 kg)  11/25/23 139 lb 9.6 oz (63.3 kg)        Physical  Exam Constitutional:      General: She is not in acute distress.    Appearance: Normal appearance. She is not ill-appearing.  Pulmonary:     Effort: Pulmonary effort is normal. No respiratory distress.  Neurological:     Mental Status: She is alert and oriented to person, place, and time.  Psychiatric:        Mood and Affect: Mood normal.        Behavior: Behavior normal.        Assessment & Plan     Problem List Items Addressed This Visit   None Visit Diagnoses       Overweight (BMI 25.0-29.9)    -  Primary   Relevant Orders   CMP14+EGFR   Hemoglobin A1c     Encounter for weight management            Assessment & Plan Overweight BMI of 27, classifying her as overweight. She is considering a GLP-1 receptor agonist (tizepatide) for weight loss from an external clinic, which requires a complete metabolic panel and A1c to determine eligibility. Discussed potential side effects of GLP-1 receptor agonists, including nausea, constipation, diarrhea, and a theoretical risk of thyroid  gland growth based on non-human studies. She is aware her BMI may not qualify her for medication coverage as it is not in the obesity range. - Order complete metabolic panel and A1c - Provide lab orders for collection at the front desk  Hypothyroidism Thyroid  levels are within normal limits. She will continue her current Synthroid  regimen. No family history of thyroid  cancer. - Continue Synthroid      No follow-ups on file.     I discussed the assessment and treatment plan with the patient. The patient was provided an opportunity to ask questions and all were answered. The patient agreed with the plan and demonstrated an understanding of the instructions.   The patient was advised to call back or seek an in-person evaluation if the symptoms worsen or if the condition fails to improve as anticipated.  I provided 15 minutes of non-face-to-face time during this encounter.   Mimi Alt, MD [facility] [phone] (phone) [phone] (fax)  [facility]

## 2024-04-12 ENCOUNTER — Ambulatory Visit: Payer: Self-pay | Admitting: Family Medicine

## 2024-04-12 LAB — CMP14+EGFR
ALT: 19 IU/L (ref 0–32)
AST: 18 IU/L (ref 0–40)
Albumin: 4.2 g/dL (ref 3.9–4.9)
Alkaline Phosphatase: 63 IU/L (ref 44–121)
BUN/Creatinine Ratio: 8 — ABNORMAL LOW (ref 9–23)
BUN: 9 mg/dL (ref 6–24)
Bilirubin Total: 0.4 mg/dL (ref 0.0–1.2)
CO2: 24 mmol/L (ref 20–29)
Calcium: 8.8 mg/dL (ref 8.7–10.2)
Chloride: 104 mmol/L (ref 96–106)
Creatinine, Ser: 1.06 mg/dL — ABNORMAL HIGH (ref 0.57–1.00)
Globulin, Total: 2.6 g/dL (ref 1.5–4.5)
Glucose: 117 mg/dL — ABNORMAL HIGH (ref 70–99)
Potassium: 3.7 mmol/L (ref 3.5–5.2)
Sodium: 140 mmol/L (ref 134–144)
Total Protein: 6.8 g/dL (ref 6.0–8.5)
eGFR: 66 mL/min/{1.73_m2} (ref >59)

## 2024-04-12 LAB — HEMOGLOBIN A1C
Est. average glucose Bld gHb Est-mCnc: 120 mg/dL
Hgb A1c MFr Bld: 5.8 % — ABNORMAL HIGH (ref 4.8–5.6)

## 2024-04-14 DIAGNOSIS — F4322 Adjustment disorder with anxiety: Secondary | ICD-10-CM | POA: Diagnosis not present

## 2024-04-16 DIAGNOSIS — Z124 Encounter for screening for malignant neoplasm of cervix: Secondary | ICD-10-CM | POA: Diagnosis not present

## 2024-04-16 DIAGNOSIS — Z01419 Encounter for gynecological examination (general) (routine) without abnormal findings: Secondary | ICD-10-CM | POA: Diagnosis not present

## 2024-04-16 DIAGNOSIS — L659 Nonscarring hair loss, unspecified: Secondary | ICD-10-CM | POA: Diagnosis not present

## 2024-04-19 LAB — HM PAP SMEAR

## 2024-07-09 ENCOUNTER — Encounter: Admitting: Family Medicine

## 2024-07-14 DIAGNOSIS — F5105 Insomnia due to other mental disorder: Secondary | ICD-10-CM | POA: Diagnosis not present

## 2024-07-14 DIAGNOSIS — F4322 Adjustment disorder with anxiety: Secondary | ICD-10-CM | POA: Diagnosis not present

## 2024-07-18 DIAGNOSIS — U071 COVID-19: Secondary | ICD-10-CM | POA: Diagnosis not present

## 2024-07-18 DIAGNOSIS — R509 Fever, unspecified: Secondary | ICD-10-CM | POA: Diagnosis not present

## 2024-07-18 DIAGNOSIS — R051 Acute cough: Secondary | ICD-10-CM | POA: Diagnosis not present

## 2024-09-16 ENCOUNTER — Other Ambulatory Visit: Payer: Self-pay | Admitting: Internal Medicine

## 2024-09-28 ENCOUNTER — Encounter: Payer: Self-pay | Admitting: Family Medicine

## 2024-09-28 ENCOUNTER — Ambulatory Visit (INDEPENDENT_AMBULATORY_CARE_PROVIDER_SITE_OTHER): Admitting: Family Medicine

## 2024-09-28 VITALS — BP 112/76 | HR 70 | Temp 98.1°F | Ht 59.0 in | Wt 123.4 lb

## 2024-09-28 DIAGNOSIS — Z130 Encounter for screening for diseases of the blood and blood-forming organs and certain disorders involving the immune mechanism: Secondary | ICD-10-CM

## 2024-09-28 DIAGNOSIS — Z0000 Encounter for general adult medical examination without abnormal findings: Secondary | ICD-10-CM

## 2024-09-28 DIAGNOSIS — E7800 Pure hypercholesterolemia, unspecified: Secondary | ICD-10-CM

## 2024-09-28 DIAGNOSIS — G479 Sleep disorder, unspecified: Secondary | ICD-10-CM

## 2024-09-28 DIAGNOSIS — F410 Panic disorder [episodic paroxysmal anxiety] without agoraphobia: Secondary | ICD-10-CM

## 2024-09-28 DIAGNOSIS — F430 Acute stress reaction: Secondary | ICD-10-CM

## 2024-09-28 DIAGNOSIS — Z1321 Encounter for screening for nutritional disorder: Secondary | ICD-10-CM

## 2024-09-28 DIAGNOSIS — Z1159 Encounter for screening for other viral diseases: Secondary | ICD-10-CM | POA: Diagnosis not present

## 2024-09-28 DIAGNOSIS — E039 Hypothyroidism, unspecified: Secondary | ICD-10-CM

## 2024-09-28 DIAGNOSIS — K5901 Slow transit constipation: Secondary | ICD-10-CM

## 2024-09-28 NOTE — Patient Instructions (Signed)
 To keep you healthy, please keep in mind the following health maintenance items that you are due for:   Health Maintenance Due  Topic Date Due   Hepatitis B Vaccines 19-59 Average Risk (1 of 3 - 19+ 3-dose series) Never done   Cervical Cancer Screening (HPV/Pap Cotest)  11/12/2024     Best Wishes,   Dr. Lang

## 2024-09-28 NOTE — Progress Notes (Signed)
 Complete physical exam   Patient: Ann Rogers   DOB: 08/03/1976   48 y.o. Female  MRN: [id number] Visit Date: 09/28/2024  Today's healthcare provider: Rockie Agent, MD   Chief Complaint  Patient presents with   Annual Exam    Patient is present for annual exam.  Well balanced diet, exercising by walking  Would like to check hep B titer before vaccinating, had pap in summer with OBGYN   Subjective    Ann Rogers is a 48 y.o. female who presents today for a complete physical exam.   She does not have additional problems to discuss today.   Discussed the use of AI scribe software for clinical note transcription with the patient, who gave verbal consent to proceed.  History of Present Illness Ann Rogers is a 48 year old female who presents for an annual physical exam.  She maintains a balanced diet and exercises by walking. Her BMI has decreased from 28 in December to 24.9 currently, attributed to lifestyle changes and the positive influence of her new [place]-oriented neighborhood. She had a Pap smear updated this past summer.  She is experiencing significant stress due to work and personal life changes, including a recent house purchase and renovations. No panic attacks. She manages stress through breathing exercises and seeking privacy. Her boss's unexpected continued presence at work adds to her stress.  She had COVID-19 for the first time in September, describing it as milder than the flu. She has received flu and COVID-19 vaccinations through her workplace.  She is considering B12 shots in a speciality clinic that follows B12 and has discussed checking her B12 levels, hepatitis B titer, thyroid  function tests (TSH, T4, T3), and other labs including CBC, CMP, hemoglobin A1c, lipase, lipid panel, and vitamin D .     Past Medical History:  Diagnosis Date   AMA (advanced maternal age) primigravida 35+    Hypothyroidism    IBS (irritable  bowel syndrome)    Slow transit constipation 06/06/2014   She has MiraLax it sounds like, she responds to intermittent Dulcolax, Linzess  290 mcg not helpful    Past Surgical History:  Procedure Laterality Date   ANAL RECTAL MANOMETRY N/A 07/01/2014   Procedure: ANAL RECTAL MANOMETRY;  Surgeon: Lupita FORBES Commander, MD;  Location: [facility];  Service: Endoscopy;  Laterality: N/A;   COLONOSCOPY     egg preservation     LASIK  11/30/2011   Social History   Socioeconomic History   Marital status: Single    Spouse name: Not on file   Number of children: 0   Years of education: Not on file   Highest education level: Master's degree (e.g., MA, MS, MEng, MEd, MSW, MBA)  Occupational History   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[employer]  Tobacco Use   Smoking status: Never   Smokeless tobacco: Never  Vaping Use   Vaping status: Never Used  Substance and Sexual Activity   Alcohol use: No   Drug use: No   Sexual activity: Not Currently  Other Topics Concern   Not on file  Social History Narrative   One child, [place] Asst Advice Worker AirportRegular exercise: noCaffeine use: coffee in the am; 3-4 sodas daily   Social Drivers of Corporate Investment Banker Strain: Low Risk  (04/03/2024)   Overall Financial Resource Strain (CARDIA)    Difficulty of Paying Living Expenses: Not hard at  all  Food Insecurity: No Food Insecurity (04/03/2024)   Hunger Vital Sign    Worried About Running Out of Food in the Last Year: Never true    Ran Out of Food in the Last Year: Never true  Transportation Needs: No Transportation Needs (04/03/2024)   PRAPARE - Administrator, Civil Service (Medical): No    Lack of Transportation (Non-Medical): No  Physical Activity: Insufficiently Active (05/16/2023)   Exercise Vital Sign    Days of Exercise per Week: 3 days    Minutes of Exercise per Session: 30 min  Stress: No  Stress Concern Present (04/03/2024)   [employer] of Occupational Health - Occupational Stress Questionnaire    Feeling of Stress : Only a little  Social Connections: Moderately Integrated (05/16/2023)   Social Connection and Isolation Panel    Frequency of Communication with Friends and Family: Three times a week    Frequency of Social Gatherings with Friends and Family: Three times a week    Attends Religious Services: More than 4 times per year    Active Member of Clubs or Organizations: Yes    Attends Banker Meetings: 1 to 4 times per year    Marital Status: Never married  Intimate Partner Violence: Not At Risk (04/03/2024)   Humiliation, Afraid, Rape, and Kick questionnaire    Fear of Current or Ex-Partner: No    Emotionally Abused: No    Physically Abused: No    Sexually Abused: No   Family Status  Relation Name Status   Mother  Deceased   Father  Deceased   Sister  Chemical Engineer  (Not Specified)   MGM  Deceased   MGF  Deceased   PGM  Deceased   PGF  Deceased   Cousin  (Not Specified)   Neg Hx  (Not Specified)  No partnership data on file   Family History  Problem Relation Age of Onset   Stroke Mother 62   Hypertension Father    Hyperlipidemia Father    Breast cancer Maternal Aunt 72   Hypertension Maternal Aunt    Hyperlipidemia Maternal Aunt    Lung cancer Maternal Grandfather 52 - 63   Miscarriages / Stillbirths Cousin    Colon cancer Neg Hx    Colon polyps Neg Hx    Esophageal cancer Neg Hx    Kidney disease Neg Hx    Rectal cancer Neg Hx    Stomach cancer Neg Hx    No Known Allergies   Medications: Outpatient Medications Prior to Visit  Medication Sig   levothyroxine  (SYNTHROID ) 50 MCG tablet TAKE 1 TABLET BY MOUTH EVERY DAY   ondansetron  (ZOFRAN -ODT) 4 MG disintegrating tablet Take 4 mg by mouth every 8 (eight) hours as needed for nausea or vomiting.   traZODone (DESYREL) 50 MG tablet Take 50 mg by mouth at bedtime.   VITAMIN D ,  CHOLECALCIFEROL, PO Take 10,000 Units by mouth daily.   No facility-administered medications prior to visit.    Review of Systems  Last CBC Lab Results  Component Value Date   WBC 7.2 02/21/2023   HGB 12.9 02/21/2023   HCT 39.5 02/21/2023   MCV 88 02/21/2023   MCH 28.9 02/21/2023   RDW 12.1 02/21/2023   PLT 249 02/21/2023   Last metabolic panel Lab Results  Component Value Date   GLUCOSE 117 (H) 04/11/2024   NA 140 04/11/2024   K 3.7 04/11/2024   CL 104 04/11/2024  CO2 24 04/11/2024   BUN 9 04/11/2024   CREATININE 1.06 (H) 04/11/2024   EGFR 66 04/11/2024   CALCIUM 8.8 04/11/2024   PHOS 3.2 02/19/2022   PROT 6.8 04/11/2024   ALBUMIN 4.2 04/11/2024   LABGLOB 2.6 04/11/2024   AGRATIO 1.4 02/21/2023   BILITOT 0.4 04/11/2024   ALKPHOS 63 04/11/2024   AST 18 04/11/2024   ALT 19 04/11/2024   Last lipids Lab Results  Component Value Date   CHOL 169 01/13/2024   HDL 66 01/13/2024   LDLCALC 95 01/13/2024   TRIG 39 01/13/2024   CHOLHDL 2.6 01/13/2024   Last hemoglobin A1c Lab Results  Component Value Date   HGBA1C 5.8 (H) 04/11/2024   Last thyroid  functions Lab Results  Component Value Date   TSH 1.810 04/03/2024   FREET4 1.45 04/03/2024   THYROIDAB 10.6 10/19/2013   Last vitamin D  Lab Results  Component Value Date   VD25OH 46.2 02/19/2022   Last vitamin B12 and Folate No results found for: VITAMINB12, FOLATE     Objective    BP 112/76 (BP Location: Right Arm, Patient Position: Sitting, Cuff Size: Normal)   Pulse 70   Temp 98.1 F (36.7 C) (Oral)   Ht 4' 11 (1.499 m)   Wt 123 lb 6.4 oz (56 kg)   LMP 09/03/2024 (Approximate)   SpO2 100%   BMI 24.92 kg/m   BP Readings from Last 3 Encounters:  09/28/24 112/76  04/03/24 115/81  01/05/24 119/74   Wt Readings from Last 3 Encounters:  09/28/24 123 lb 6.4 oz (56 kg)  04/03/24 137 lb (62.1 kg)  01/05/24 139 lb (63 kg)        Physical Exam Vitals reviewed.  Constitutional:       General: She is not in acute distress.    Appearance: Normal appearance. She is not ill-appearing, toxic-appearing or diaphoretic.  HENT:     Head: Normocephalic and atraumatic.     Right Ear: Tympanic membrane and external ear normal. There is no impacted cerumen.     Left Ear: Tympanic membrane and external ear normal. There is no impacted cerumen.     Nose: Nose normal.     Mouth/Throat:     Pharynx: Oropharynx is clear.  Eyes:     General: No scleral icterus.    Extraocular Movements: Extraocular movements intact.     Conjunctiva/sclera: Conjunctivae normal.     Pupils: Pupils are equal, round, and reactive to light.  Cardiovascular:     Rate and Rhythm: Normal rate and regular rhythm.     Pulses: Normal pulses.     Heart sounds: Normal heart sounds. No murmur heard.    No friction rub. No gallop.  Pulmonary:     Effort: Pulmonary effort is normal. No respiratory distress.     Breath sounds: Normal breath sounds. No wheezing, rhonchi or rales.  Abdominal:     General: Bowel sounds are normal. There is no distension.     Palpations: Abdomen is soft. There is no mass.     Tenderness: There is no abdominal tenderness. There is no guarding.  Musculoskeletal:        General: No deformity.     Cervical back: Normal range of motion and neck supple.     Right lower leg: No edema.     Left lower leg: No edema.  Lymphadenopathy:     Cervical: No cervical adenopathy.  Skin:    General: Skin is warm.  Capillary Refill: Capillary refill takes less than 2 seconds.     Findings: No erythema or rash.  Neurological:     General: No focal deficit present.     Mental Status: She is alert and oriented to person, place, and time.     Cranial Nerves: Cranial nerves 2-12 are intact. No cranial nerve deficit or facial asymmetry.     Motor: Motor function is intact. No weakness.     Gait: Gait normal.  Psychiatric:        Mood and Affect: Mood normal.        Behavior: Behavior normal.        Last depression screening scores    09/28/2024    8:51 AM 04/03/2024    8:22 AM 01/05/2024    3:12 PM  PHQ 2/9 Scores  PHQ - 2 Score 0 0 0  PHQ- 9 Score 2 3     Last fall risk screening    01/05/2024    3:12 PM  Fall Risk   Falls in the past year? 0    Last Audit-C alcohol use screening    04/03/2024    8:21 AM  Alcohol Use Disorder Test (AUDIT)  1. How often do you have a drink containing alcohol? 0  2. How many drinks containing alcohol do you have on a typical day when you are drinking? 0  3. How often do you have six or more drinks on one occasion? 0  AUDIT-C Score 0   A score of 3 or more in women, and 4 or more in men indicates increased risk for alcohol abuse, EXCEPT if all of the points are from question 1   No results found for any visits on 09/28/24.  Assessment & Plan    Routine Health Maintenance and Physical Exam  Immunization History  Administered Date(s) Administered   Influenza Inj Mdck Quad Pf 09/06/2016   Influenza Inj Mdck Quad With Preservative 08/15/2018   Influenza, Quadrivalent, Recombinant, Inj, Pf 09/07/2021   Influenza, Seasonal, Injecte, Preservative Fre 08/13/2024   Influenza,inj,Quad PF,6+ Mos 10/09/2015, 08/13/2019, 07/25/2020   Influenza-Unspecified 10/09/2015, 08/29/2021, 08/29/2022   Moderna Covid-19 Fall Seasonal Vaccine 62yrs & older 08/13/2024   PFIZER(Purple Top)SARS-COV-2 Vaccination 02/09/2020, 03/04/2020, 09/08/2020, 09/29/2021, 08/29/2022   Pfizer Covid-19 Vaccine Bivalent Booster 56yrs & up 10/30/2021   Pfizer(Comirnaty)Fall Seasonal Vaccine 12 years and older 09/24/2022   Tdap 11/04/2015, 11/09/2016    Health Maintenance  Topic Date Due   Hepatitis B Vaccines 19-59 Average Risk (1 of 3 - 19+ 3-dose series) Never done   Cervical Cancer Screening (HPV/Pap Cotest)  11/12/2024   Mammogram  01/23/2026   DTaP/Tdap/Td (3 - Td or Tdap) 11/09/2026   Colonoscopy  04/11/2033   Influenza Vaccine  Completed   COVID-19 Vaccine   Completed   Hepatitis C Screening  Completed   HIV Screening  Completed   Pneumococcal Vaccine  Aged Out   HPV VACCINES  Aged Out   Meningococcal B Vaccine  Aged Out    Problem List Items Addressed This Visit     Elevated LDL cholesterol level   Relevant Orders   Lipid panel   Hypothyroidism   Panic attack as reaction to stress   Relevant Medications   traZODone (DESYREL) 50 MG tablet   Sleep disturbance   Slow transit constipation   Other Visit Diagnoses       Annual physical exam    -  Primary   Relevant Orders   Vitamin D  (25 hydroxy)  Vitamin B12   Comprehensive Metabolic Panel (CMET)   Lipid panel   TSH+T4F+T3Free   Lipase   HgB A1c   Hepatitis B Surface AntiBODY     Screening for deficiency anemia       Relevant Orders   Vitamin B12   CBC     Need for hepatitis B screening test       Relevant Orders   Hepatitis B Surface AntiBODY     Encounter for vitamin deficiency screening           Assessment and Plan Assessment & Plan Adult Wellness Visit Annual physical examination conducted. BMI improved from 28 in December to 24.9 currently. No panic attacks reported despite stressors. Managing stress with breathing exercises and creating a quiet environment. No significant anxiety symptoms requiring intervention. - Ordered CBC, CMP, hep B antibodies, hemoglobin A1c, lipase, lipid panel, TSH, T4, T3, B12, vitamin D  - Scheduled follow-up in 6 months  Pure hypercholesterolemia Chronic  Elevated LDL cholesterol. Plan to monitor lipid levels. - Ordered lipid panel  Hypothyroidism Chronic  Continue Synthroid  50mcg daily  Plan to monitor thyroid  function. - Ordered TSH, T4, T3         Return in about 6 months (around 03/28/2025) for Thyroid .       Rockie Agent, MD  [facility] [phone] (phone) [phone] (fax)  [facility]

## 2024-09-29 LAB — COMPREHENSIVE METABOLIC PANEL WITH GFR
ALT: 22 IU/L (ref 0–32)
AST: 24 IU/L (ref 0–40)
Albumin: 4.3 g/dL (ref 3.9–4.9)
Alkaline Phosphatase: 62 IU/L (ref 41–116)
BUN/Creatinine Ratio: 15 (ref 9–23)
BUN: 15 mg/dL (ref 6–24)
Bilirubin Total: 0.4 mg/dL (ref 0.0–1.2)
CO2: 22 mmol/L (ref 20–29)
Calcium: 9 mg/dL (ref 8.7–10.2)
Chloride: 103 mmol/L (ref 96–106)
Creatinine, Ser: 1.01 mg/dL — ABNORMAL HIGH (ref 0.57–1.00)
Globulin, Total: 2.7 g/dL (ref 1.5–4.5)
Glucose: 90 mg/dL (ref 70–99)
Potassium: 4.1 mmol/L (ref 3.5–5.2)
Sodium: 139 mmol/L (ref 134–144)
Total Protein: 7 g/dL (ref 6.0–8.5)
eGFR: 70 mL/min/1.73 (ref >59)

## 2024-09-29 LAB — CBC
Hematocrit: 38.7 % (ref 34.0–46.6)
Hemoglobin: 12.3 g/dL (ref 11.1–15.9)
MCH: 29.1 pg (ref 26.6–33.0)
MCHC: 31.8 g/dL (ref 31.5–35.7)
MCV: 92 fL (ref 79–97)
Platelets: 284 x10E3/uL (ref 150–450)
RBC: 4.22 x10E6/uL (ref 3.77–5.28)
RDW: 12.5 % (ref 11.7–15.4)
WBC: 6.8 x10E3/uL (ref 3.4–10.8)

## 2024-09-29 LAB — TSH+T4F+T3FREE
Free T4: 1.43 ng/dL (ref 0.82–1.77)
T3, Free: 2.6 pg/mL (ref 2.0–4.4)
TSH: 2.4 u[IU]/mL (ref 0.450–4.500)

## 2024-09-29 LAB — HEMOGLOBIN A1C
Est. average glucose Bld gHb Est-mCnc: 108 mg/dL
Hgb A1c MFr Bld: 5.4 % (ref 4.8–5.6)

## 2024-09-29 LAB — LIPID PANEL
Chol/HDL Ratio: 2.7 ratio (ref 0.0–4.4)
Cholesterol, Total: 194 mg/dL (ref 100–199)
HDL: 73 mg/dL (ref >39)
LDL Chol Calc (NIH): 114 mg/dL — ABNORMAL HIGH (ref 0–99)
Triglycerides: 37 mg/dL (ref 0–149)
VLDL Cholesterol Cal: 7 mg/dL (ref 5–40)

## 2024-09-29 LAB — HEPATITIS B SURFACE ANTIBODY,QUALITATIVE: Hep B Surface Ab, Qual: REACTIVE

## 2024-09-29 LAB — LIPASE: Lipase: 35 U/L (ref 14–72)

## 2024-09-29 LAB — VITAMIN D 25 HYDROXY (VIT D DEFICIENCY, FRACTURES): Vit D, 25-Hydroxy: 42.1 ng/mL (ref 30.0–100.0)

## 2024-10-01 ENCOUNTER — Encounter: Payer: Self-pay | Admitting: Obstetrics and Gynecology

## 2024-10-01 ENCOUNTER — Ambulatory Visit: Payer: Self-pay | Admitting: Family Medicine

## 2024-11-21 DIAGNOSIS — M25562 Pain in left knee: Secondary | ICD-10-CM | POA: Diagnosis not present

## 2024-11-21 DIAGNOSIS — M25561 Pain in right knee: Secondary | ICD-10-CM | POA: Diagnosis not present

## 2024-11-26 ENCOUNTER — Encounter: Payer: Self-pay | Admitting: Internal Medicine

## 2024-11-26 ENCOUNTER — Ambulatory Visit: Payer: Federal, State, Local not specified - PPO | Admitting: Internal Medicine

## 2024-11-26 VITALS — BP 112/64 | HR 77 | Ht 59.0 in | Wt 121.4 lb

## 2024-11-26 DIAGNOSIS — E039 Hypothyroidism, unspecified: Secondary | ICD-10-CM | POA: Diagnosis not present

## 2024-11-26 MED ORDER — LEVOTHYROXINE SODIUM 50 MCG PO TABS: 50.0000 ug | ORAL_TABLET | Freq: Every day | ORAL | 3 refills | Status: DC

## 2024-11-26 NOTE — Patient Instructions (Addendum)
Please continue Levothyroxine 50 mcg daily.  Take the thyroid hormone every day, with water, at least 30 minutes before breakfast, separated by at least 4 hours from: - acid reflux medications - calcium - iron - multivitamins  Please come back for a follow-up appointment in 1 year.   

## 2024-11-26 NOTE — Progress Notes (Signed)
 Patient ID: Ann Rogers, female   DOB: 26-Feb-1976, 48 y.o.   MRN: [id number]  HPI  Ann Rogers is a 48 y.o.-year-old female, returning for f/u for hypothyroidism, dx 07/2013. Last visit 1 year ago. She is here with her daughter, Ann Rogers.  Interim history: She still has fatigue and is not sleep very well. She has IBS with constipation -she continues MiraLAX. She gained 7 lbs before last OV, but lost 19 pounds since then. She is still commuting to work - more than an hour.  She did move since last visit-to [place].  She had a lot of stress at work, especially with the government shutdown a month ago.  She feels that her weight loss is related to this. She has L knee pain - will have an MRI.  Reviewed history: Pt. has been dx with hypothyroidism in 07/2013 (TSH high, but pt not told how high, and records not available) >> started on Levothyroxine  25 mcg.  During pregnancy, with increased the dose of her levothyroxine  to 75 mcg daily and we decreased it back after the pregnancy to 50 mcg daily.  Pt is on levothyroxine  50 mcg daily, taken: - in am - fasting - at least 30 min from b'fast - no Ca, Fe, PPIs - stopped MVIs at night - + on Biotin - 120 mcg - + vitamin D   TFTs were reviewed: Lab Results  Component Value Date   TSH 2.400 09/28/2024   TSH 1.810 04/03/2024   TSH 2.70 11/25/2023   TSH 1.64 11/12/2022   TSH 1.50 11/06/2021   FREET4 1.43 09/28/2024   FREET4 1.45 04/03/2024   FREET4 1.3 11/25/2023   FREET4 1.02 11/12/2022   FREET4 1.13 11/06/2021   Her thyroid  antibodies were not elevated:  Thyroid  Peroxidase Antibody 10/19/2013 10.6  <35.0 IU/mL Final   Pt denies: - feeling nodules in neck - hoarseness - dysphagia - choking  No FH of thyroid  disease or thyroid  cancer. No h/o radiation tx to head or neck. No herbal supplements. No Biotin use. No recent steroids use.   She has a h/o vitamin D  deficiency -on 10,000 units vitamin D  daily - but forgetting  doses. She previously lost weight on the [employer] diet -stopped 06/2020.  ROS: + see HPI  I reviewed pt's medications, allergies, PMH, social hx, family hx, and changes were documented in the history of present illness. Otherwise, unchanged from my initial visit note.  Past Medical History:  Diagnosis Date   AMA (advanced maternal age) primigravida 35+    Hypothyroidism    IBS (irritable bowel syndrome)    Slow transit constipation 06/06/2014   She has MiraLax it sounds like, she responds to intermittent Dulcolax, Linzess  290 mcg not helpful    Past Surgical History:  Procedure Laterality Date   ANAL RECTAL MANOMETRY N/A 07/01/2014   Procedure: ANAL RECTAL MANOMETRY;  Surgeon: Lupita FORBES Commander, MD;  Location: [facility];  Service: Endoscopy;  Laterality: N/A;   COLONOSCOPY     egg preservation     LASIK  11/30/2011   Social History   Socioeconomic History   Marital status: Single    Spouse name: Not on file   Number of children: 0   Years of education: Not on file   Highest education level: Master's degree (e.g., MA, MS, MEng, MEd, MSW, MBA)  Occupational History   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[employer]  Tobacco Use   Smoking status: Never  Smokeless tobacco: Never  Vaping Use   Vaping status: Never Used  Substance and Sexual Activity   Alcohol use: No   Drug use: No   Sexual activity: Not Currently  Other Topics Concern   Not on file  Social History Narrative   One child, lives in [place] Asst Advice Worker AirportRegular exercise: noCaffeine use: coffee in the am; 3-4 sodas daily   Social Drivers of Health   Tobacco Use: Low Risk (09/28/2024)   Patient History    Smoking Tobacco Use: Never    Smokeless Tobacco Use: Never    Passive Exposure: Not on file  Financial Resource Strain: Low Risk (04/03/2024)   Overall Financial Resource Strain (CARDIA)    Difficulty of Paying Living  Expenses: Not hard at all  Food Insecurity: No Food Insecurity (04/03/2024)   Hunger Vital Sign    Worried About Running Out of Food in the Last Year: Never true    Ran Out of Food in the Last Year: Never true  Transportation Needs: No Transportation Needs (04/03/2024)   PRAPARE - Administrator, Civil Service (Medical): No    Lack of Transportation (Non-Medical): No  Physical Activity: Insufficiently Active (05/16/2023)   Exercise Vital Sign    Days of Exercise per Week: 3 days    Minutes of Exercise per Session: 30 min  Stress: No Stress Concern Present (04/03/2024)   [employer] of Occupational Health - Occupational Stress Questionnaire    Feeling of Stress : Only a little  Social Connections: Moderately Integrated (05/16/2023)   Social Connection and Isolation Panel    Frequency of Communication with Friends and Family: Three times a week    Frequency of Social Gatherings with Friends and Family: Three times a week    Attends Religious Services: More than 4 times per year    Active Member of Clubs or Organizations: Yes    Attends Banker Meetings: 1 to 4 times per year    Marital Status: Never married  Intimate Partner Violence: Not At Risk (04/03/2024)   Humiliation, Afraid, Rape, and Kick questionnaire    Fear of Current or Ex-Partner: No    Emotionally Abused: No    Physically Abused: No    Sexually Abused: No  Depression (PHQ2-9): Low Risk (09/28/2024)   Depression (PHQ2-9)    PHQ-2 Score: 2  Alcohol Screen: Low Risk (04/03/2024)   Alcohol Screen    Last Alcohol Screening Score (AUDIT): 0  Housing: Unknown (04/03/2024)   Housing Stability Vital Sign    Unable to Pay for Housing in the Last Year: No    Number of Times Moved in the Last Year: Not on file    Homeless in the Last Year: No  Utilities: Not At Risk (04/03/2024)   AHC Utilities    Threatened with loss of utilities: No  Health Literacy: Adequate Health Literacy (04/03/2024)   B13[facility] Health  Literacy    Frequency of need for help with medical instructions: Never   Current Outpatient Medications on File Prior to Visit  Medication Sig Dispense Refill   levothyroxine  (SYNTHROID ) 50 MCG tablet TAKE 1 TABLET BY MOUTH EVERY DAY 90 tablet 0   ondansetron  (ZOFRAN -ODT) 4 MG disintegrating tablet Take 4 mg by mouth every 8 (eight) hours as needed for nausea or vomiting.     traZODone (DESYREL) 50 MG tablet Take 50 mg by mouth at bedtime.     VITAMIN D , CHOLECALCIFEROL, PO Take 10,000 Units by mouth daily.  No current facility-administered medications on file prior to visit.   No Known Allergies Family History  Problem Relation Age of Onset   Stroke Mother 73   Hypertension Father    Hyperlipidemia Father    Breast cancer Maternal Aunt 72   Hypertension Maternal Aunt    Hyperlipidemia Maternal Aunt    Lung cancer Maternal Grandfather 52 - 65   Miscarriages / Stillbirths Cousin    Colon cancer Neg Hx    Colon polyps Neg Hx    Esophageal cancer Neg Hx    Kidney disease Neg Hx    Rectal cancer Neg Hx    Stomach cancer Neg Hx    PE: BP 112/64   Pulse 77   Ht 4' 11 (1.499 m)   Wt 121 lb 6.4 oz (55.1 kg)   SpO2 99%   BMI 24.52 kg/m   Wt Readings from Last 10 Encounters:  11/26/24 121 lb 6.4 oz (55.1 kg)  09/28/24 123 lb 6.4 oz (56 kg)  04/03/24 137 lb (62.1 kg)  01/05/24 139 lb (63 kg)  11/25/23 139 lb 9.6 oz (63.3 kg)  05/17/23 134 lb 9.6 oz (61.1 kg)  04/12/23 128 lb (58.1 kg)  03/16/23 128 lb (58.1 kg)  02/21/23 136 lb (61.7 kg)  11/12/22 132 lb 3.2 oz (60 kg)   Constitutional: normal weight, in NAD Eyes:  EOMI, no exophthalmos ENT: no neck masses, no cervical lymphadenopathy Cardiovascular: RRR, No MRG Respiratory: CTA B Musculoskeletal: no deformities Skin:no rashes Neurological: no tremor with outstretched hands  ASSESSMENT: 1. Hypothyroidism  PLAN:  1. Patient with controlled hypothyroidism, on low-dose levothyroxine  - latest 2 TSH levels from  03/2024 and 08/2024 reviewed with pt. >> normal: Lab Results  Component Value Date   TSH 2.400 09/28/2024  - she continues on LT4 50 mcg daily - pt feels good on this dose.  She lost a significant amount of weight since last visit, 18 pounds.  She feels that this was related to increased stress at work. She has chronic IBS with constipation.  She is using MiraLAX as needed. She felt that Linzess  was not working well for her.  -At today's visit we discussed that with weight loss we sometimes need to decrease the LT4 dose.  However, her weight loss was most significant over the summer, before she had her last TSH and also she is using such a low dose, that for now, I do not feel we need to recheck her TFTs or change her LT4 dose. - we discussed about taking the thyroid  hormone every day, with water, >30 minutes before breakfast, separated by >4 hours from acid reflux medications, calcium, iron, multivitamins. Pt. is taking it correctly. - will check thyroid  tests at next visit - I refilled her LT4 Rx for now - OTW, I will see her back in a year  Requested Prescriptions   Signed Prescriptions Disp Refills   levothyroxine  (SYNTHROID ) 50 MCG tablet 90 tablet 3    Sig: Take 1 tablet (50 mcg total) by mouth daily.   Lela Fendt, MD PhD [facility]

## 2024-12-21 ENCOUNTER — Other Ambulatory Visit: Payer: Self-pay | Admitting: Internal Medicine

## 2025-03-28 ENCOUNTER — Ambulatory Visit: Admitting: Family Medicine

## 2025-11-26 ENCOUNTER — Ambulatory Visit: Admitting: Internal Medicine
